# Patient Record
Sex: Female | Born: 1980 | Race: Black or African American | Hispanic: No | Marital: Single | Smoking: Never smoker
Health system: Southern US, Community
[De-identification: ages and names within clinical notes are randomized; demographics above are authoritative.]

---

## 2016-05-05 ENCOUNTER — Other Ambulatory Visit (HOSPITAL_COMMUNITY)
Admission: RE | Admit: 2016-05-05 | Discharge: 2016-05-05 | Disposition: A | Discharge status: Home / Self Care | Payer: Managed Care, Other (non HMO) | Source: Ambulatory Visit | Attending: Obstetrics & Gynecology | Admitting: Obstetrics & Gynecology

## 2016-05-05 ENCOUNTER — Other Ambulatory Visit: Payer: Self-pay | Admitting: Obstetrics & Gynecology

## 2016-05-05 DIAGNOSIS — Z01419 Encounter for gynecological examination (general) (routine) without abnormal findings: Secondary | ICD-10-CM | POA: Diagnosis present

## 2016-05-05 DIAGNOSIS — Z1151 Encounter for screening for human papillomavirus (HPV): Secondary | ICD-10-CM | POA: Diagnosis not present

## 2016-05-05 DIAGNOSIS — Z113 Encounter for screening for infections with a predominantly sexual mode of transmission: Secondary | ICD-10-CM | POA: Diagnosis present

## 2016-05-10 LAB — CYTOLOGY - PAP
Chlamydia: NEGATIVE
DIAGNOSIS: NEGATIVE
HPV (WINDOPATH): NOT DETECTED
Neisseria Gonorrhea: NEGATIVE

## 2016-10-07 ENCOUNTER — Emergency Department (HOSPITAL_COMMUNITY)
Admission: EM | Admit: 2016-10-07 | Discharge: 2016-10-07 | Disposition: A | Discharge status: Home / Self Care | Payer: Managed Care, Other (non HMO) | Attending: Emergency Medicine | Admitting: Emergency Medicine

## 2016-10-07 ENCOUNTER — Encounter (HOSPITAL_COMMUNITY): Payer: Self-pay | Admitting: Emergency Medicine

## 2016-10-07 ENCOUNTER — Emergency Department (HOSPITAL_COMMUNITY): Payer: Managed Care, Other (non HMO)

## 2016-10-07 DIAGNOSIS — K429 Umbilical hernia without obstruction or gangrene: Secondary | ICD-10-CM | POA: Insufficient documentation

## 2016-10-07 DIAGNOSIS — R110 Nausea: Secondary | ICD-10-CM | POA: Diagnosis not present

## 2016-10-07 DIAGNOSIS — N938 Other specified abnormal uterine and vaginal bleeding: Secondary | ICD-10-CM | POA: Insufficient documentation

## 2016-10-07 DIAGNOSIS — R109 Unspecified abdominal pain: Secondary | ICD-10-CM | POA: Diagnosis present

## 2016-10-07 LAB — CBC WITH DIFFERENTIAL/PLATELET
Basophils Absolute: 0 10*3/uL (ref 0.0–0.1)
Basophils Relative: 0 %
EOS PCT: 1 %
Eosinophils Absolute: 0.1 10*3/uL (ref 0.0–0.7)
HCT: 38.6 % (ref 36.0–46.0)
HEMOGLOBIN: 13.2 g/dL (ref 12.0–15.0)
LYMPHS PCT: 40 %
Lymphs Abs: 3.1 10*3/uL (ref 0.7–4.0)
MCH: 29.9 pg (ref 26.0–34.0)
MCHC: 34.2 g/dL (ref 30.0–36.0)
MCV: 87.5 fL (ref 78.0–100.0)
Monocytes Absolute: 0.3 10*3/uL (ref 0.1–1.0)
Monocytes Relative: 4 %
NEUTROS PCT: 55 %
Neutro Abs: 4.3 10*3/uL (ref 1.7–7.7)
PLATELETS: 336 10*3/uL (ref 150–400)
RBC: 4.41 MIL/uL (ref 3.87–5.11)
RDW: 13 % (ref 11.5–15.5)
WBC: 7.8 10*3/uL (ref 4.0–10.5)

## 2016-10-07 LAB — URINALYSIS, ROUTINE W REFLEX MICROSCOPIC
Bilirubin Urine: NEGATIVE
Glucose, UA: NEGATIVE mg/dL
Ketones, ur: NEGATIVE mg/dL
NITRITE: NEGATIVE
Protein, ur: NEGATIVE mg/dL
SPECIFIC GRAVITY, URINE: 1.005 (ref 1.005–1.030)
pH: 8 (ref 5.0–8.0)

## 2016-10-07 LAB — COMPREHENSIVE METABOLIC PANEL
ALK PHOS: 27 U/L — AB (ref 38–126)
ALT: 10 U/L — AB (ref 14–54)
AST: 22 U/L (ref 15–41)
Albumin: 3.5 g/dL (ref 3.5–5.0)
Anion gap: 9 (ref 5–15)
BUN: 5 mg/dL — AB (ref 6–20)
CHLORIDE: 102 mmol/L (ref 101–111)
CO2: 26 mmol/L (ref 22–32)
Calcium: 8.8 mg/dL — ABNORMAL LOW (ref 8.9–10.3)
Creatinine, Ser: 0.52 mg/dL (ref 0.44–1.00)
GFR calc Af Amer: >60 mL/min (ref >60)
Glucose, Bld: 84 mg/dL (ref 65–99)
Potassium: 3.5 mmol/L (ref 3.5–5.1)
Sodium: 137 mmol/L (ref 135–145)
Total Bilirubin: 1 mg/dL (ref 0.3–1.2)
Total Protein: 6.9 g/dL (ref 6.5–8.1)

## 2016-10-07 LAB — LIPASE, BLOOD: LIPASE: 18 U/L (ref 11–51)

## 2016-10-07 LAB — POC URINE PREG, ED: PREG TEST UR: NEGATIVE

## 2016-10-07 MED ORDER — IOPAMIDOL (ISOVUE-300) INJECTION 61%
INTRAVENOUS | Status: AC
  Administered 2016-10-07: 80 mL
  Filled 2016-10-07: qty 100

## 2016-10-07 NOTE — ED Provider Notes (Signed)
[facility] Provider Note   CSN: [id number] Arrival date & time: 10/07/16  1040  By signing my name below, I, Thelma Barge, attest that this documentation has been prepared under the direction and in the presence of No att. providers found. Electronically Signed: Thelma Barge, Scribe. 10/07/16. 1:15 PM.  History   Chief Complaint Chief Complaint  Patient presents with  . Abdominal Pain   The history is provided by the patient. No language interpreter was used.   HPI Comments: Pamela Craig is a 36 y.o. female with no pertinent PMHx or PSHx who presents to the Emergency Department complaining of waxing and waning, aching abdominal pain that began x3 days ago. She has associated nausea. She states she noticed a lump about a month ago but did not think too much of it after it went away. She states when she urinates, her abdomen pain worsens. Pt notes she is spotting and has not had a cycle in a few months due to her birth control.   She denies fever, vomiting, and discharge. Pt is allergic to aspirin.       History reviewed. No pertinent past medical history.  There are no active problems to display for this patient.   History reviewed. No pertinent surgical history.  OB History    No data available       Home Medications    Prior to Admission medications   Not on File    Family History History reviewed. No pertinent family history.  Social History Social History  Substance Use Topics  . Smoking status: Never Smoker  . Smokeless tobacco: Never Used  . Alcohol use Yes     Comment: occ     Allergies   Aspirin   Review of Systems Review of Systems  Constitutional: Negative for fever.  Gastrointestinal: Positive for abdominal pain and nausea. Negative for vomiting.  Genitourinary: Positive for vaginal bleeding (spotting). Negative for vaginal discharge.  All other systems reviewed and are negative.    Physical Exam Updated Vital Signs BP  127/76 (BP Location: Right Arm)   Pulse 92   Temp 99.5 F (37.5 C) (Oral)   Resp 19   SpO2 98%   Physical Exam  Constitutional: She is oriented to person, place, and time. She appears well-developed and well-nourished.  HENT:  Head: Normocephalic and atraumatic.  Cardiovascular: Normal rate and regular rhythm.   No murmur heard. Pulmonary/Chest: Effort normal and breath sounds normal. No respiratory distress.  Abdominal: Soft. There is tenderness. There is no rebound and no guarding.  Mild lower abdominal tenderness 1cm umbilical hernia that is easily reducible on examination  Musculoskeletal: She exhibits no edema or tenderness.  Neurological: She is alert and oriented to person, place, and time.  Skin: Skin is warm and dry.  Psychiatric: She has a normal mood and affect. Her behavior is normal.  Nursing note and vitals reviewed.    ED Treatments / Results  DIAGNOSTIC STUDIES: Oxygen Saturation is 97% on RA, normal by my interpretation.    COORDINATION OF CARE: 12:27 PM Discussed treatment plan with pt at bedside and pt agreed to plan.  Labs (all labs ordered are listed, but only abnormal results are displayed) Labs Reviewed  URINALYSIS, ROUTINE W REFLEX MICROSCOPIC - Abnormal; Notable for the following:       Result Value   Color, Urine STRAW (*)    Hgb urine dipstick MODERATE (*)    Leukocytes, UA TRACE (*)    Bacteria, UA RARE (*)  Squamous Epithelial / LPF 0-5 (*)    All other components within normal limits  COMPREHENSIVE METABOLIC PANEL - Abnormal; Notable for the following:    BUN 5 (*)    Calcium 8.8 (*)    ALT 10 (*)    Alkaline Phosphatase 27 (*)    All other components within normal limits  CBC WITH DIFFERENTIAL/PLATELET  LIPASE, BLOOD  POC URINE PREG, ED    EKG  EKG Interpretation None       Radiology Ct Abdomen Pelvis W Contrast  Result Date: 10/07/2016 CLINICAL DATA:  Umbilical pain. Nausea and vomiting. Pain for 1 month increasing over  several days EXAM: CT ABDOMEN AND PELVIS WITH CONTRAST TECHNIQUE: Multidetector CT imaging of the abdomen and pelvis was performed using the standard protocol following bolus administration of intravenous contrast. CONTRAST:  80mL ISOVUE-300 IOPAMIDOL (ISOVUE-300) INJECTION 61% COMPARISON:  None. FINDINGS: Lower chest: Lung bases are clear. Hepatobiliary: No focal hepatic lesion. No biliary duct dilatation. Gallbladder is normal. Common bile duct is normal. Pancreas: Pancreas is normal. No ductal dilatation. No pancreatic inflammation. Spleen: Normal spleen Adrenals/urinary tract: Adrenal glands and kidneys are normal. The ureters and bladder normal. Stomach/Bowel: The stomach, small bowel, appendix and cecum normal. The colon and rectosigmoid colon are normal. There is laxity of the abdominal wall midline with a loop of colon approximating the skin surface (image 47, series 3; sagittal image 74, series 7) at the level the umbilicus. Vascular/Lymphatic: Abdominal aorta is normal caliber. There is no retroperitoneal or periportal lymphadenopathy. No pelvic lymphadenopathy. Reproductive: Uterus and ovaries normal Other: No free fluid. Musculoskeletal: No aggressive osseous lesion. IMPRESSION: 1. Laxity abdominal wall at the umbilicus with a loop of colon approximating the ventral abdominal wall within 5 mm the skin surface. No frank herniation. This could represent a region of discomfort. 2. Normal appendix. 3. No obstructive uropathy. Electronically Signed   By: Genevive BiStewart  Edmunds M.D.   On: 10/07/2016 14:32    Procedures Procedures (including critical care time)  Medications Ordered in ED Medications  iopamidol (ISOVUE-300) 61 % injection (80 mLs  Contrast Given 10/07/16 1415)     Initial Impression / Assessment and Plan / ED Course  I have reviewed the triage vital signs and the nursing notes.  Pertinent labs & imaging results that were available during my care of the patient were reviewed by me and  considered in my medical decision making (see chart for details).     Patient here for central and lower abdominal pain, has a hernia on examination that is easily reproducible. No evidence of obstruction or incarceration on examination or imaging. UA is not consistent with UTI, will not treat for infection. Counseled pt on home care for hernia with outpatient surgery follow-up, lifting precautions. Return precautions discussed.  Final Clinical Impressions(s) / ED Diagnoses   Final diagnoses:  Umbilical hernia without obstruction and without gangrene    New Prescriptions There are no discharge medications for this patient. I personally performed the services described in this documentation, which was scribed in my presence. The recorded information has been reviewed and is accurate.    Tilden Fossaees, Satoru Milich, MD 10/07/16 1544

## 2016-10-07 NOTE — ED Notes (Signed)
Pt going to CT

## 2016-10-07 NOTE — ED Triage Notes (Signed)
Pt sts abd pain with possible hernia near belly button per pt; pt sts some dysuria today also

## 2016-10-07 NOTE — Progress Notes (Signed)
Orthopedic Tech Progress Note Patient Details:  Pamela Craig 28-Jun-1980 [id number]  Ortho Devices Type of Ortho Device: Abdominal binder Ortho Device/Splint Location: abdomen Ortho Device/Splint Interventions: Freeman CaldronOrdered   [name] 10/07/2016, 3:18 PM

## 2017-08-24 ENCOUNTER — Encounter (HOSPITAL_COMMUNITY): Payer: Self-pay | Admitting: Emergency Medicine

## 2017-08-24 ENCOUNTER — Ambulatory Visit (HOSPITAL_COMMUNITY)
Admission: EM | Admit: 2017-08-24 | Discharge: 2017-08-24 | Disposition: A | Discharge status: Home / Self Care | Payer: Managed Care, Other (non HMO) | Attending: Family Medicine | Admitting: Family Medicine

## 2017-08-24 DIAGNOSIS — R509 Fever, unspecified: Secondary | ICD-10-CM | POA: Diagnosis not present

## 2017-08-24 DIAGNOSIS — Z886 Allergy status to analgesic agent status: Secondary | ICD-10-CM | POA: Insufficient documentation

## 2017-08-24 DIAGNOSIS — J029 Acute pharyngitis, unspecified: Secondary | ICD-10-CM | POA: Insufficient documentation

## 2017-08-24 LAB — POCT RAPID STREP A: Streptococcus, Group A Screen (Direct): NEGATIVE

## 2017-08-24 MED ORDER — AMOXICILLIN 875 MG PO TABS: 875.0000 mg | ORAL_TABLET | Freq: Two times a day (BID) | ORAL | 0 refills | Status: AC

## 2017-08-24 NOTE — ED Provider Notes (Signed)
[facility]   [id number] 08/24/17 Arrival Time: 1003  ASSESSMENT & PLAN:  1. Sore throat   2. Fever, unspecified fever cause    Suspicion for strep. Will treat.  Meds ordered this encounter  Medications  . amoxicillin (AMOXIL) 875 MG tablet    Sig: Take 1 tablet (875 mg total) by mouth 2 (two) times daily for 10 days.    Dispense:  20 tablet    Refill:  0    Results for orders placed or performed during the hospital encounter of 08/24/17  POCT rapid strep A [facility])  Result Value Ref Range   Streptococcus, Group A Screen (Direct) NEGATIVE NEGATIVE   Labs Reviewed  CULTURE, GROUP A STREP [facility])  POCT RAPID STREP A   Work note given. OTC analgesics and throat care as needed  Instructed to finish full 10 day course of antibiotics. Will follow up if not showing significant improvement over the next 24-48 hours.  Reviewed expectations re: course of current medical issues. Questions answered. Outlined signs and symptoms indicating need for more acute intervention. Patient verbalized understanding. After Visit Summary given.   SUBJECTIVE:  Pamela Craig is a 37 y.o. female who reports a sore throat. Describes as pain with swallowing. Onset abrupt beginning 1 day ago. No respiratory symptoms. Normal PO intake but reports discomfort with swallowing. Fever reported: yes, subjective with chills. No associated n/v/abdominal symptoms. Sick contacts: none.  OTC treatment: Analgesics without much relief.  ROS: As per HPI.   OBJECTIVE:  Vitals:   08/24/17 1043 08/24/17 1045  BP:  134/83  Pulse:  90  Resp:  16  Temp:  100.2 F (37.9 C)  TempSrc:  Oral  SpO2:  96%  Weight: 150 lb (68 kg)     General appearance: alert; no distress HEENT: throat with marked erythema and exudates present; uvula midline Neck: supple with FROM; small bilateral cervical LAD, tender Lungs: clear to auscultation bilaterally Skin: reveals no rash; warm and  dry Psychological: alert and cooperative; normal mood and affect  Allergies  Allergen Reactions  . Aspirin      Social History   Socioeconomic History  . Marital status: Single    Spouse name: Not on file  . Number of children: Not on file  . Years of education: Not on file  . Highest education level: Not on file  Occupational History  . Not on file  Social Needs  . Financial resource strain: Not on file  . Food insecurity:    Worry: Not on file    Inability: Not on file  . Transportation needs:    Medical: Not on file    Non-medical: Not on file  Tobacco Use  . Smoking status: Never Smoker  . Smokeless tobacco: Never Used  Substance and Sexual Activity  . Alcohol use: Yes    Comment: occ  . Drug use: No  . Sexual activity: Not on file  Lifestyle  . Physical activity:    Days per week: Not on file    Minutes per session: Not on file  . Stress: Not on file  Relationships  . Social connections:    Talks on phone: Not on file    Gets together: Not on file    Attends religious service: Not on file    Active member of club or organization: Not on file    Attends meetings of clubs or organizations: Not on file    Relationship status: Not on file  .  Intimate partner violence:    Fear of current or ex partner: Not on file    Emotionally abused: Not on file    Physically abused: Not on file    Forced sexual activity: Not on file  Other Topics Concern  . Not on file  Social History Narrative  . Not on file   No family history on file.        Mardella LaymanHagler, Kerria Sapien, MD 08/24/17 1140

## 2017-08-24 NOTE — ED Triage Notes (Signed)
PT reports sore throat, chills, body aches for 2 days.

## 2017-08-24 NOTE — Discharge Instructions (Addendum)
You may use over the counter ibuprofen or acetaminophen as needed.  For a sore throat, over the counter products such as [employer] Peroxyl Mouth Sore Rinse or Chloraseptic Sore Throat Spray may provide some temporary relief. Your rapid strep test was negative today. We have sent your throat swab for culture and will let you know of any positive results. 

## 2017-08-26 ENCOUNTER — Telehealth (HOSPITAL_COMMUNITY): Payer: Self-pay

## 2017-08-26 LAB — CULTURE, GROUP A STREP (THRC)

## 2017-08-26 NOTE — Telephone Encounter (Signed)
Returned patients call. No answer at this time. Voicemail left for patient.

## 2017-08-26 NOTE — Telephone Encounter (Signed)
Pt aware results from recent visit being within normal range. Answered all questions.

## 2017-11-08 ENCOUNTER — Ambulatory Visit (HOSPITAL_COMMUNITY)
Admission: EM | Admit: 2017-11-08 | Discharge: 2017-11-08 | Disposition: A | Discharge status: Home / Self Care | Payer: Managed Care, Other (non HMO) | Attending: Emergency Medicine | Admitting: Emergency Medicine

## 2017-11-08 ENCOUNTER — Encounter (HOSPITAL_COMMUNITY): Payer: Self-pay | Admitting: Emergency Medicine

## 2017-11-08 DIAGNOSIS — Z634 Disappearance and death of family member: Secondary | ICD-10-CM

## 2017-11-08 DIAGNOSIS — R079 Chest pain, unspecified: Secondary | ICD-10-CM | POA: Diagnosis not present

## 2017-11-08 DIAGNOSIS — R0789 Other chest pain: Secondary | ICD-10-CM | POA: Diagnosis not present

## 2017-11-08 LAB — POCT I-STAT, CHEM 8
BUN: 5 mg/dL — ABNORMAL LOW (ref 6–20)
CALCIUM ION: 1.18 mmol/L (ref 1.15–1.40)
CREATININE: 0.5 mg/dL (ref 0.44–1.00)
Chloride: 104 mmol/L (ref 101–111)
GLUCOSE: 91 mg/dL (ref 65–99)
HCT: 41 % (ref 36.0–46.0)
Hemoglobin: 13.9 g/dL (ref 12.0–15.0)
Potassium: 3.3 mmol/L — ABNORMAL LOW (ref 3.5–5.1)
Sodium: 142 mmol/L (ref 135–145)
TCO2: 24 mmol/L (ref 22–32)

## 2017-11-08 MED ORDER — CYCLOBENZAPRINE HCL 5 MG PO TABS: 5.0000 mg | ORAL_TABLET | Freq: Three times a day (TID) | ORAL | 0 refills | Status: AC | PRN

## 2017-11-08 NOTE — ED Triage Notes (Signed)
PT reports central chest pain. PT reports pain is intermittent and does not follow any patterns. Occurs randomly. Is sometimes worse when raising left arm. PT's mother passed away recently due to heart attack so PT wants to rule out cardiac causes.

## 2017-11-08 NOTE — ED Provider Notes (Signed)
MRN: [id number] DOB: 1980/06/26  Subjective:   Pamela Craig is a 37 y.o. female presenting for ~1 month history of atypical central, left lateral chest pain. It occurs randomly in either spot, can worsen by lifting her left arm. Can last up to an hour. Resolves on its own by resting, breathing deeply or walking. Can have some nausea with her symptoms. Denies fever, abdominal pain, diaphoresis. Also denies trauma, falls, strenuous exercise. Denies smoking cigarettes. Has occasional drink of alcohol.  Of note, patient reports that her mother passed away in March 2019 from cardiac arrest, had been battling stomach cancer.  She is very worried about her heart.  Her significant other presents with her as well and states that they are having a very difficult time with her relationship at the moment.  He reports that they are working on the relationship but endorses that there is an anxiety component to her chest pain.  She does not have a PCP.  Denies any chronic medications.   Allergies  Allergen Reactions  . Aspirin    Denies past medical history and past surgical history.   Objective:   Vitals: BP 112/74   Pulse 66   Temp 98.7 F (37.1 C) (Oral)   Resp 16   Wt 160 lb (72.6 kg)   LMP 10/24/2017   SpO2 100%   Physical Exam  Constitutional: She is oriented to person, place, and time. She appears well-developed and well-nourished.  HENT:  Mouth/Throat: Oropharynx is clear and moist.  Eyes: Pupils are equal, round, and reactive to light. EOM are normal.  Neck: Normal range of motion. Neck supple. No thyromegaly present.  Cardiovascular: Normal rate, regular rhythm and intact distal pulses. Exam reveals no gallop and no friction rub.  No murmur heard. Pulmonary/Chest: Effort normal. No respiratory distress. She has no wheezes. She has no rales.  Neurological: She is alert and oriented to person, place, and time.  Skin: Skin is warm and dry.  Psychiatric:  Flat affect, teary-eyed.    ED ECG REPORT   Date: 11/08/2017  Rate: sinus bradycardia at 58bpm  Rhythm: sinus bradycardia  QRS Axis: normal  Intervals: normal  ST/T Wave abnormalities: T-wave inversion in leads V1, V2  Conduction Disutrbances:none  Narrative Interpretation: sinus bradycardia  Old EKG Reviewed: none available  I have personally reviewed the EKG tracing and agree with the computerized printout as noted.  Results for orders placed or performed during the hospital encounter of 11/08/17 (from the past 24 hour(s))  I-STAT, chem 8     Status: Abnormal   Collection Time: 11/08/17  8:13 PM  Result Value Ref Range   Sodium 142 135 - 145 mmol/L   Potassium 3.3 (L) 3.5 - 5.1 mmol/L   Chloride 104 101 - 111 mmol/L   BUN 5 (L) 6 - 20 mg/dL   Creatinine, Ser 4.090.50 0.44 - 1.00 mg/dL   Glucose, Bld 91 65 - 99 mg/dL   Calcium, Ion 8.111.18 9.141.15 - 1.40 mmol/L   TCO2 24 22 - 32 mmol/L   Hemoglobin 13.9 12.0 - 15.0 g/dL   HCT 78.241.0 95.636.0 - 21.346.0 %    Assessment and Plan :   Atypical chest pain  Death of family member  Chest pain of unclear etiology given lack of trauma, strenuous activity and reassuring vital signs.  Potassium is borderline low which may be a source but is nonspecific.  Counseled patient on sources of chest pain including emotional and physical stress.  Will manage it as  musculoskeletal type pain with rest, Flexeril and hydration.  Patient was instructed to eat potassium rich foods.  She agreed to establish care with a PCP for further work-up.  ER and return to clinic precautions discussed.   Wallis Bamberg, PA-C 11/09/17 1050

## 2017-11-08 NOTE — Discharge Instructions (Addendum)
Hydrate well with at least 2 liters (1 gallon) of water daily.   Nuts, such as peanuts and pistachios. Seeds, such as sunflower seeds and pumpkin seeds. Peas, lentils, and lima beans. Whole grain and bran cereals and breads. Fresh fruits and vegetables, such as apricots, avocado, bananas, cantaloupe, kiwi, oranges, tomatoes, asparagus, and potatoes. Orange juice. Tomato juice. Red meats. Yogurt.  Wallis BambergMario Adisyn Ruscitti, PA-C [facility] at [facility] : [facility] [address], [place], [place] [address] [phone]

## 2018-01-16 ENCOUNTER — Encounter: Payer: Self-pay | Admitting: Emergency Medicine

## 2018-01-16 ENCOUNTER — Ambulatory Visit (HOSPITAL_COMMUNITY)
Admission: EM | Admit: 2018-01-16 | Discharge: 2018-01-16 | Disposition: A | Discharge status: Home / Self Care | Payer: Managed Care, Other (non HMO) | Attending: Family Medicine | Admitting: Family Medicine

## 2018-01-16 DIAGNOSIS — H9202 Otalgia, left ear: Secondary | ICD-10-CM

## 2018-01-16 MED ORDER — NEOMYCIN-POLYMYXIN-HC 3.5-10000-1 OT SUSP: 4.0000 [drp] | Freq: Four times a day (QID) | OTIC | 0 refills | Status: AC

## 2018-01-16 MED ORDER — FLUTICASONE PROPIONATE 50 MCG/ACT NA SUSP: 1.0000 | Freq: Every day | NASAL | 2 refills | Status: AC

## 2018-01-16 NOTE — ED Provider Notes (Signed)
[facility]    CSN: [id number] Arrival date & time: 01/16/18  1535     History   Chief Complaint Chief Complaint  Patient presents with  . Otalgia    Left Ear     HPI Pamela Craig is a 37 y.o. female.   Pamela Craig presents with complaints of left ear pain and decreased hearing. States she felt like it popped this morning and then hearing decreased. She put a cotton ball in the ear this am and noticed small amount of blood. No drainage since. No fevers. Onset this am. Felt well yesterday. Did develop some nasal congestion yesterday and had taken benadryl. No cough or sore throat. No rash. Has not taken any medications for symptoms. Ear feels "sensitive." Without contributing medical history.      ROS per HPI.      History reviewed. No pertinent past medical history.  There are no active problems to display for this patient.   History reviewed. No pertinent surgical history.  OB History   None      Home Medications    Prior to Admission medications   Medication Sig Start Date End Date Taking? Authorizing Provider  cyclobenzaprine (FLEXERIL) 5 MG tablet Take 1 tablet (5 mg total) by mouth 3 (three) times daily as needed for muscle spasms. 11/08/17   Wallis BambergMani, Mario, PA-C  fluticasone (FLONASE) 50 MCG/ACT nasal spray Place 1 spray into both nostrils daily. 01/16/18   Georgetta HaberBurky, Natalie B, NP  neomycin-polymyxin-hydrocortisone (CORTISPORIN) 3.5-10000-1 OTIC suspension Place 4 drops into the left ear 4 (four) times daily for 7 days. 01/16/18 01/23/18  Georgetta HaberBurky, Natalie B, NP    Family History No family history on file.  Social History Social History   Tobacco Use  . Smoking status: Never Smoker  . Smokeless tobacco: Never Used  Substance Use Topics  . Alcohol use: Yes    Comment: occ  . Drug use: No     Allergies   Aspirin   Review of Systems Review of Systems   Physical Exam Triage Vital Signs ED Triage Vitals  Enc Vitals Group     BP 01/16/18  1604 111/82     Pulse Rate 01/16/18 1604 76     Resp 01/16/18 1604 20     Temp 01/16/18 1604 97.9 F (36.6 C)     Temp Source 01/16/18 1604 Temporal     SpO2 01/16/18 1604 98 %     Weight --      Height --      Head Circumference --      Peak Flow --      Pain Score 01/16/18 1602 7     Pain Loc --      Pain Edu? --      Excl. in GC? --    No data found.  Updated Vital Signs BP 111/82 (BP Location: Right Arm)   Pulse 76   Temp 97.9 F (36.6 C) (Temporal)   Resp 20   LMP 01/10/2018   SpO2 98%    Physical Exam  Constitutional: She is oriented to person, place, and time. She appears well-developed and well-nourished. No distress.  HENT:  Head: Normocephalic and atraumatic.  Right Ear: Tympanic membrane, external ear and ear canal normal.  Left Ear: Tympanic membrane and external ear normal.  Nose: Nose normal.  Mouth/Throat: Uvula is midline, oropharynx is clear and moist and mucous membranes are normal. No tonsillar exudate.  Left TM WNL; tenderness to canal with exam however,  no drainage; no significant swelling or pus drainage  Eyes: Pupils are equal, round, and reactive to light. Conjunctivae and EOM are normal.  Cardiovascular: Normal rate, regular rhythm and normal heart sounds.  Pulmonary/Chest: Effort normal and breath sounds normal.  Neurological: She is alert and oriented to person, place, and time.  Skin: Skin is warm and dry.     UC Treatments / Results  Labs (all labs ordered are listed, but only abnormal results are displayed) Labs Reviewed - No data to display  EKG None  Radiology No results found.  Procedures Procedures (including critical care time)  Medications Ordered in UC Medications - No data to display  Initial Impression / Assessment and Plan / UC Course  I have reviewed the triage vital signs and the nursing notes.  Pertinent labs & imaging results that were available during my care of the patient were reviewed by me and considered  in my medical decision making (see chart for details).     Tm intact, no further drainage. Will provide drops for external pain, use of flonase to help with congestion/popping sensation. If symptoms worsen or do not improve in the next week to return to be seen or to follow up with PCP.  Patient verbalized understanding and agreeable to plan.    Final Clinical Impressions(s) / UC Diagnoses   Final diagnoses:  Left ear pain     Discharge Instructions     We will treat external ear infection with ear drops, please complete. You may also use daily flonase to help with any fluid behind ear drum, use daily.  If symptoms worsen or do not improve in the next week to return to be seen or to follow up with your PCP.     ED Prescriptions    Medication Sig Dispense Auth. Provider   neomycin-polymyxin-hydrocortisone (CORTISPORIN) 3.5-10000-1 OTIC suspension Place 4 drops into the left ear 4 (four) times daily for 7 days. 10 mL Linus Mako B, NP   fluticasone (FLONASE) 50 MCG/ACT nasal spray Place 1 spray into both nostrils daily. 16 g Georgetta Haber, NP     Controlled Substance Prescriptions [place] Controlled Substance Registry consulted? Not Applicable   Georgetta Haber, NP 01/16/18 1704

## 2018-01-16 NOTE — Discharge Instructions (Signed)
We will treat external ear infection with ear drops, please complete. You may also use daily flonase to help with any fluid behind ear drum, use daily.  If symptoms worsen or do not improve in the next week to return to be seen or to follow up with your PCP.

## 2018-01-16 NOTE — ED Triage Notes (Signed)
Pt presents with pain left ear, lose of hearing in that ear and a bloody drainage from that ear.  Pt states she heard a pop before she started feeling symptoms, has not been traveling or put anything in her ear.

## 2018-06-06 ENCOUNTER — Emergency Department (HOSPITAL_COMMUNITY): Payer: Managed Care, Other (non HMO)

## 2018-06-06 ENCOUNTER — Emergency Department (HOSPITAL_COMMUNITY)
Admission: EM | Admit: 2018-06-06 | Discharge: 2018-06-06 | Disposition: A | Discharge status: Home / Self Care | Payer: Managed Care, Other (non HMO) | Attending: Emergency Medicine | Admitting: Emergency Medicine

## 2018-06-06 ENCOUNTER — Encounter: Payer: Self-pay | Admitting: Emergency Medicine

## 2018-06-06 DIAGNOSIS — Z3A01 Less than 8 weeks gestation of pregnancy: Secondary | ICD-10-CM | POA: Diagnosis not present

## 2018-06-06 DIAGNOSIS — O23599 Infection of other part of genital tract in pregnancy, unspecified trimester: Secondary | ICD-10-CM

## 2018-06-06 DIAGNOSIS — O200 Threatened abortion: Secondary | ICD-10-CM | POA: Diagnosis not present

## 2018-06-06 DIAGNOSIS — O23591 Infection of other part of genital tract in pregnancy, first trimester: Secondary | ICD-10-CM | POA: Diagnosis not present

## 2018-06-06 DIAGNOSIS — B9689 Other specified bacterial agents as the cause of diseases classified elsewhere: Secondary | ICD-10-CM | POA: Insufficient documentation

## 2018-06-06 DIAGNOSIS — O2391 Unspecified genitourinary tract infection in pregnancy, first trimester: Secondary | ICD-10-CM | POA: Diagnosis not present

## 2018-06-06 DIAGNOSIS — O2341 Unspecified infection of urinary tract in pregnancy, first trimester: Secondary | ICD-10-CM

## 2018-06-06 DIAGNOSIS — O209 Hemorrhage in early pregnancy, unspecified: Secondary | ICD-10-CM | POA: Diagnosis present

## 2018-06-06 LAB — URINALYSIS, ROUTINE W REFLEX MICROSCOPIC
BILIRUBIN URINE: NEGATIVE
GLUCOSE, UA: NEGATIVE mg/dL
KETONES UR: NEGATIVE mg/dL
Nitrite: POSITIVE — AB
PH: 7 (ref 5.0–8.0)
Protein, ur: NEGATIVE mg/dL
Specific Gravity, Urine: 1.006 (ref 1.005–1.030)

## 2018-06-06 LAB — CBC
HEMATOCRIT: 37.9 % (ref 36.0–46.0)
HEMOGLOBIN: 12.7 g/dL (ref 12.0–15.0)
MCH: 30 pg (ref 26.0–34.0)
MCHC: 33.5 g/dL (ref 30.0–36.0)
MCV: 89.6 fL (ref 80.0–100.0)
PLATELETS: 289 10*3/uL (ref 150–400)
RBC: 4.23 MIL/uL (ref 3.87–5.11)
RDW: 12.9 % (ref 11.5–15.5)
WBC: 6.1 10*3/uL (ref 4.0–10.5)
nRBC: 0 % (ref 0.0–0.2)

## 2018-06-06 LAB — ABO/RH: ABO/RH(D): A POS

## 2018-06-06 LAB — WET PREP, GENITAL
SPERM: NONE SEEN
Trich, Wet Prep: NONE SEEN
Yeast Wet Prep HPF POC: NONE SEEN

## 2018-06-06 LAB — HCG, QUANTITATIVE, PREGNANCY: hCG, Beta Chain, Quant, S: 8884 m[IU]/mL — ABNORMAL HIGH (ref <5)

## 2018-06-06 MED ORDER — CLINDAMYCIN PHOSPHATE 2 % VA CREA: 1.0000 | TOPICAL_CREAM | Freq: Every day | VAGINAL | 0 refills | Status: AC

## 2018-06-06 MED ORDER — CEPHALEXIN 500 MG PO CAPS: 500.0000 mg | ORAL_CAPSULE | Freq: Four times a day (QID) | ORAL | 0 refills | Status: AC

## 2018-06-06 NOTE — ED Triage Notes (Signed)
Pt reports she is [redacted] weeks pregnant and today had vaginal spotting that went away but when at work noticed more brighter red blood on panty-liner but didn't saturate the pad. Pt reports some abd cramping.

## 2018-06-06 NOTE — ED Provider Notes (Signed)
[facility]-EMERGENCY DEPT Provider Note   CSN: [id number] Arrival date & time: 06/06/18  1050     History   Chief Complaint Chief Complaint  Patient presents with  . Vaginal Bleeding    HPI Pamela Craig is a 38 y.o. female.  HPI Patient developed vaginal bleeding last night.  States she is about [redacted] weeks pregnant last menses middle of December.  Has positive home [facility] test and [facility]  Last night began to have some vaginal bleeding some dull abdominal cramping.  States she feels that she could getting her menses.  Slightly red blood but is only used a couple pads.  Does not know her blood type.  [facility] but has not gone to the appointment yet. History reviewed. No pertinent past medical history.  There are no active problems to display for this patient.   History reviewed. No pertinent surgical history.   OB History    Gravida  1   Para      Term      Preterm      AB      Living        SAB      TAB      Ectopic      Multiple      Live Births               Home Medications    Prior to Admission medications   Medication Sig Start Date End Date Taking? Authorizing Provider  Prenatal Vit-Fe Fumarate-FA (PRENATAL PO) Take 1 tablet by mouth 2 (two) times daily.   Yes [provider]  cephALEXin (KEFLEX) 500 MG capsule Take 1 capsule (500 mg total) by mouth 4 (four) times daily. 06/06/18   Benjiman CorePickering, Vickie Melnik, MD  clindamycin (CLEOCIN) 2 % vaginal cream Place 1 Applicatorful vaginally at bedtime for 7 days. 06/06/18 06/13/18  Benjiman CorePickering, Crist Kruszka, MD  cyclobenzaprine (FLEXERIL) 5 MG tablet Take 1 tablet (5 mg total) by mouth 3 (three) times daily as needed for muscle spasms. Patient not taking: Reported on 06/06/2018 11/08/17   Wallis BambergMani, Mario, PA-C  fluticasone [facility](FLONASE) 50 MCG/ACT nasal spray Place 1 spray into both nostrils daily. Patient not taking: Reported on  06/06/2018 01/16/18   Georgetta HaberBurky, Natalie B, NP    Family History No family history on file.  Social History Social History   Tobacco Use  . Smoking status: Never Smoker  . Smokeless tobacco: Never Used  Substance Use Topics  . Alcohol use: Yes    Comment: occ  . Drug use: No     Allergies   Aspirin   Review of Systems Review of Systems  Constitutional: Negative for appetite change.  HENT: Negative for congestion.   Respiratory: Negative for shortness of breath.   Cardiovascular: Negative for chest pain.  Gastrointestinal: Negative for abdominal pain.  Genitourinary: Positive for vaginal bleeding. Negative for flank pain and vaginal discharge.  Musculoskeletal: Negative for back pain.  Skin: Negative for rash.  Neurological: Negative for weakness.  Psychiatric/Behavioral: Negative for confusion.     Physical Exam Updated Vital Signs BP 119/78   Pulse 78   Temp 98.6 F (37 C) (Oral)   Resp 15   Ht 5\' 2"  (1.575 m)   Wt 64 kg   LMP 04/27/2018   SpO2 100%   BMI 25.79 kg/m   Physical Exam HENT:     Head: Normocephalic.     Mouth/Throat:  Mouth: Mucous membranes are moist.  Eyes:     General: No scleral icterus. Cardiovascular:     Rate and Rhythm: Normal rate.  Pulmonary:     Effort: Pulmonary effort is normal.  Abdominal:     Tenderness: There is no abdominal tenderness.  Musculoskeletal:     Right lower leg: No edema.     Left lower leg: No edema.  Skin:    General: Skin is warm.     Coloration: Skin is not jaundiced.  Neurological:     Mental Status: She is alert.   Pelvic exam showed closed os, but has moderate white discharge.  No cervical motion tenderness.  No adnexal tenderness.   ED Treatments / Results  Labs (all labs ordered are listed, but only abnormal results are displayed) Labs Reviewed  WET PREP, GENITAL - Abnormal; Notable for the following components:      Result Value   Clue Cells Wet Prep HPF POC PRESENT (*)    WBC, Wet  Prep HPF POC FEW (*)    All other components within normal limits  HCG, QUANTITATIVE, PREGNANCY - Abnormal; Notable for the following components:   hCG, Beta Chain, Quant, S 8,884 (*)    All other components within normal limits  URINALYSIS, ROUTINE W REFLEX MICROSCOPIC - Abnormal; Notable for the following components:   APPearance HAZY (*)    Hgb urine dipstick SMALL (*)    Nitrite POSITIVE (*)    Leukocytes, UA SMALL (*)    Bacteria, UA MANY (*)    All other components within normal limits  CBC  RPR  HIV ANTIBODY (ROUTINE TESTING W REFLEX)  ABO/RH  GC/CHLAMYDIA PROBE AMP ([place]) NOT AT [facility]    EKG None  Radiology US Ob Comp < 14 Wks  Result Date: 06/06/2018 CLINICAL DATA:  Initial evaluation for acute vaginal bleeding, early pregnancy. EXAM: OBSTETRIC <14 WK [place] AND TRANSVAGINAL OB US TECHNIQUE: Both transabdominal and transvaginal ultrasound examinations were performed for complete evaluation of the gestation as well as the maternal uterus, adnexal regions, and pelvic cul-de-sac. Transvaginal technique was performed to assess early pregnancy. COMPARISON:  None. FINDINGS: Intrauterine gestational sac: Single Yolk sac:  Not visualized. Embryo:  Not visualized. Cardiac Activity: N/A Heart Rate: N/A  bpm MSD: 8.2 mm   5 w   4 d Subchorionic hemorrhage:  None visualized. Maternal uterus/adnexae: 2.2 x 1.5 x 1.4 cm mildly complex right ovarian cyst with peripherally increased vascularity, most consistent with a small corpus luteal cyst. Small amount of adjacent free fluid. Left ovary normal in appearance. Possible focal lesion within the left aspect of the myometrium measures 2.0 x 1.8 x 1.7 cm, potentially reflecting a small fibroid. IMPRESSION: 1. Probable early intrauterine gestational sac, but no yolk sac, fetal pole, or cardiac activity yet visualized. Recommend follow-up quantitative B-HCG levels and follow-up US in 14 days to assess viability. This recommendation follows SRU  consensus guidelines: Diagnostic Criteria for Nonviable Pregnancy Early in the First Trimester. [name] Med 2013; 353:9122-58. 2. 2.2 cm right ovarian corpus luteal cyst with associated small amount of free fluid within the pelvis. 3. Possible 2 cm fibroid within the left aspect of the uterus. Electronically Signed   By: Rise Mu M.D.   On: 06/06/2018 18:31   US Ob Transvaginal  Result Date: 06/06/2018 CLINICAL DATA:  Initial evaluation for acute vaginal bleeding, early pregnancy. EXAM: OBSTETRIC <14 WK [place] AND TRANSVAGINAL OB US TECHNIQUE: Both transabdominal and transvaginal ultrasound examinations were performed  for complete evaluation of the gestation as well as the maternal uterus, adnexal regions, and pelvic cul-de-sac. Transvaginal technique was performed to assess early pregnancy. COMPARISON:  None. FINDINGS: Intrauterine gestational sac: Single Yolk sac:  Not visualized. Embryo:  Not visualized. Cardiac Activity: N/A Heart Rate: N/A  bpm MSD: 8.2 mm   5 w   4 d Subchorionic hemorrhage:  None visualized. Maternal uterus/adnexae: 2.2 x 1.5 x 1.4 cm mildly complex right ovarian cyst with peripherally increased vascularity, most consistent with a small corpus luteal cyst. Small amount of adjacent free fluid. Left ovary normal in appearance. Possible focal lesion within the left aspect of the myometrium measures 2.0 x 1.8 x 1.7 cm, potentially reflecting a small fibroid. IMPRESSION: 1. Probable early intrauterine gestational sac, but no yolk sac, fetal pole, or cardiac activity yet visualized. Recommend follow-up quantitative B-HCG levels and follow-up US in 14 days to assess viability. This recommendation follows SRU consensus guidelines: Diagnostic Criteria for Nonviable Pregnancy Early in the First Trimester. [name] Med 2013; 497:0263-78. 2. 2.2 cm right ovarian corpus luteal cyst with associated small amount of free fluid within the pelvis. 3. Possible 2 cm fibroid within the left aspect of  the uterus. Electronically Signed   By: Rise Mu M.D.   On: 06/06/2018 18:31    Procedures Procedures (including critical care time)  Medications Ordered in ED Medications - No data to display   Initial Impression / Assessment and Plan / ED Course  I have reviewed the triage vital signs and the nursing notes.  Pertinent labs & imaging results that were available during my care of the patient were reviewed by me and considered in my medical decision making (see chart for details).     Patient with vaginal bleeding.  Around [redacted] weeks pregnant.  Threatened miscarriage.  Quantitative hCG of 8800.  Urine shows possible infection and will treat.  Also BV on wet prep.  Also treated.  Follow-up with OB/GYN.  Final Clinical Impressions(s) / ED Diagnoses   Final diagnoses:  Threatened miscarriage  Urinary tract infection in mother during first trimester of pregnancy  Bacterial vaginosis in pregnancy    ED Discharge Orders         Ordered    cephALEXin (KEFLEX) 500 MG capsule  4 times daily     06/06/18 2116    clindamycin (CLEOCIN) 2 % vaginal cream  Daily at bedtime     06/06/18 2116           Benjiman Core, MD 06/06/18 2132

## 2018-06-06 NOTE — ED Triage Notes (Signed)
Pt reported to registration that she is going to have to leave to pick up her kids from school and that she will be back.

## 2018-06-07 LAB — RPR: RPR Ser Ql: NONREACTIVE

## 2018-06-07 LAB — GC/CHLAMYDIA PROBE AMP (~~LOC~~) NOT AT ARMC
Chlamydia: NEGATIVE
Neisseria Gonorrhea: NEGATIVE

## 2018-06-07 LAB — HIV ANTIBODY (ROUTINE TESTING W REFLEX): HIV Screen 4th Generation wRfx: NONREACTIVE

## 2018-06-18 IMAGING — CT CT ABD-PELV W/ CM
2 of 4 series · 16 of 46 positions shown, 18 images · IV contrast (iopamidol)
Comparison: None.

CLINICAL DATA: Umbilical pain. Nausea and vomiting. Pain for 1
month increasing over several days

EXAM:
CT ABDOMEN AND PELVIS WITH CONTRAST
TECHNIQUE: Multidetector CT imaging of the abdomen and pelvis was performed
using the standard protocol following bolus administration of
intravenous contrast.
CONTRAST:  80mL [id number] IOPAMIDOL ([id number]) INJECTION 61%

[Series 3: abdroutine 5.0 i31s 1 · axial · 0.62mm/px · z∈[-426,-26]mm · 13 of 90 slices shown, 15 images]
[im 5/90  soft-tissue]
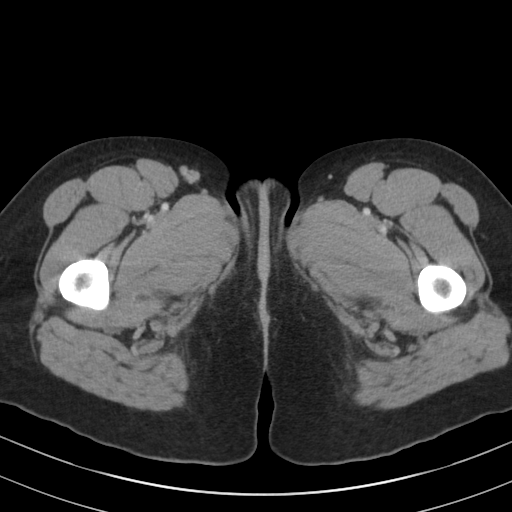
[im 5/90  bone]
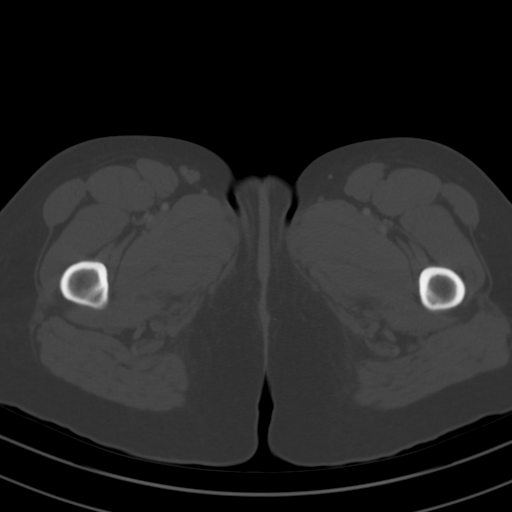
[im 13/90  soft-tissue]
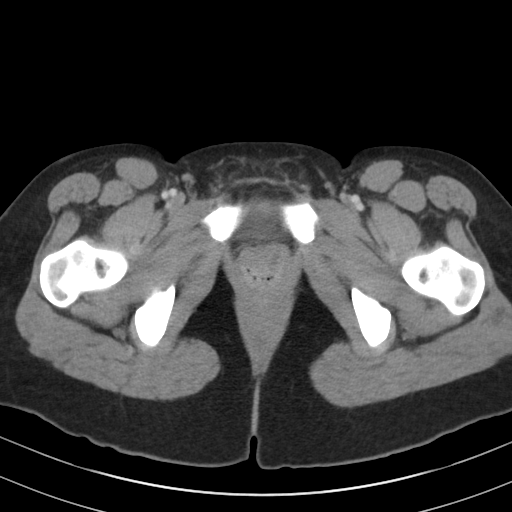
[im 21/90  soft-tissue]
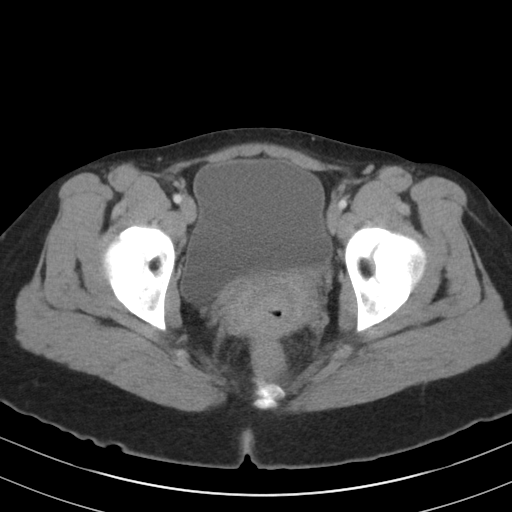
[im 25/90  soft-tissue]
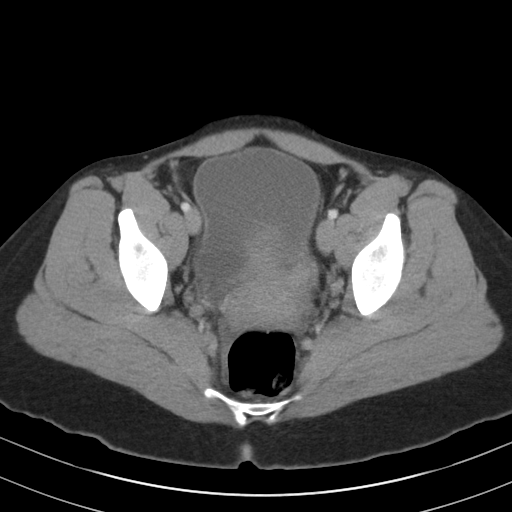
[im 33/90  soft-tissue]
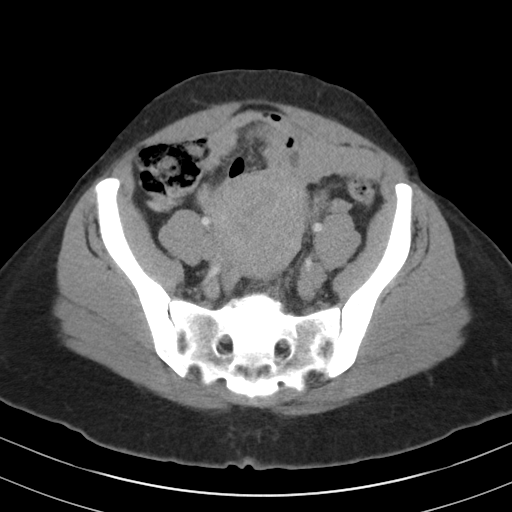
[im 37/90  soft-tissue]
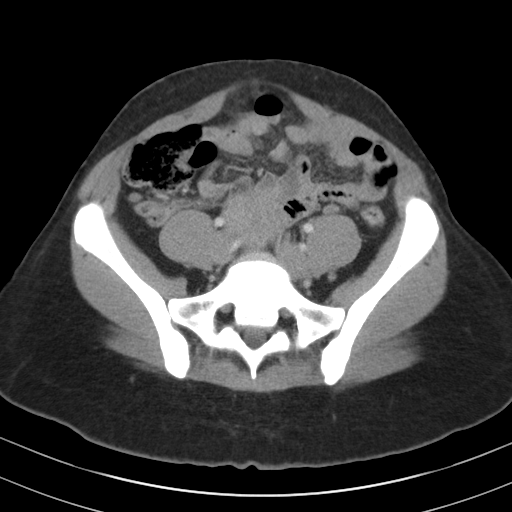
[im 45/90  soft-tissue]
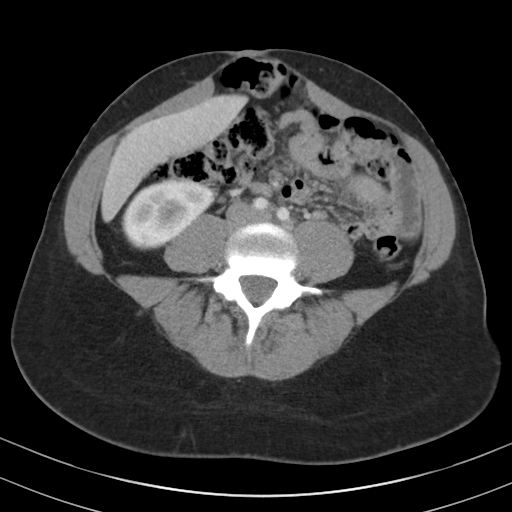
[im 53/90  soft-tissue]
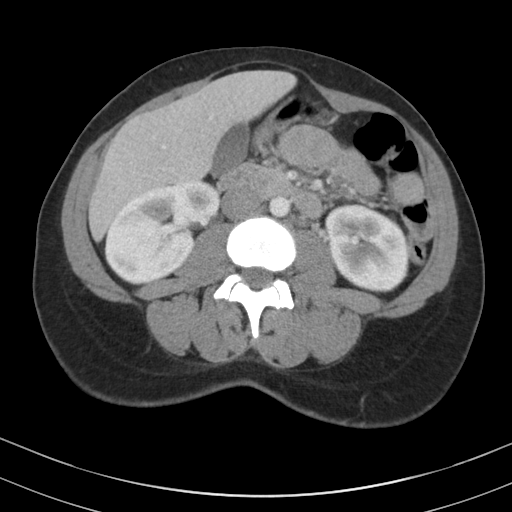
[im 57/90  soft-tissue]
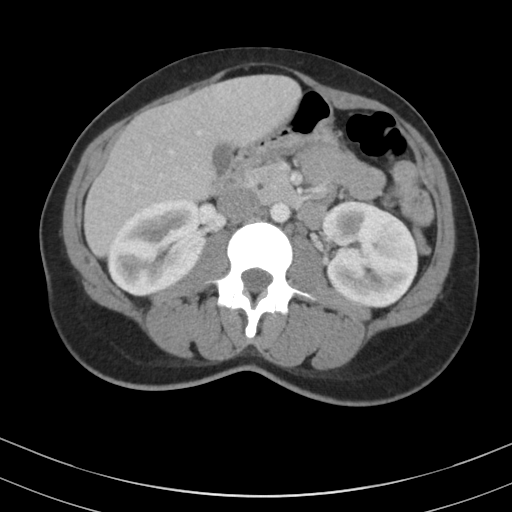
[im 57/90  bone]
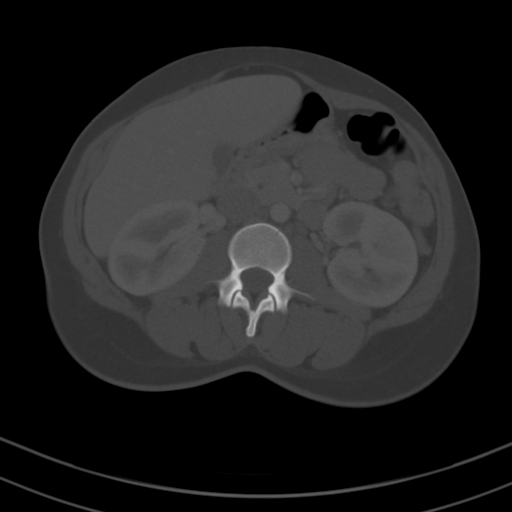
[im 65/90  soft-tissue]
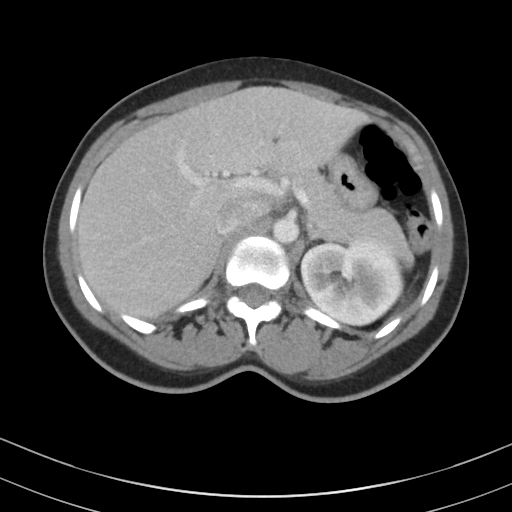
[im 69/90  soft-tissue]
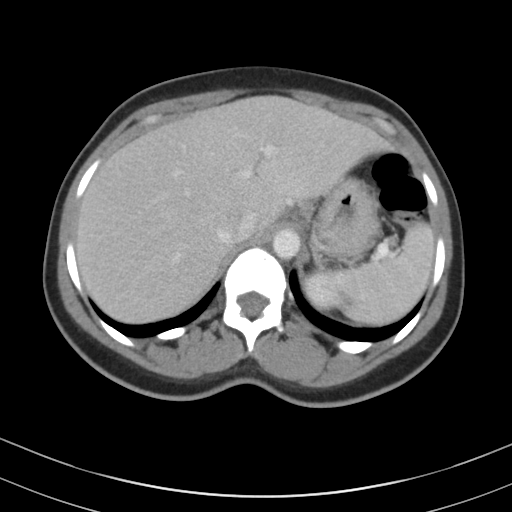
[im 77/90  soft-tissue]
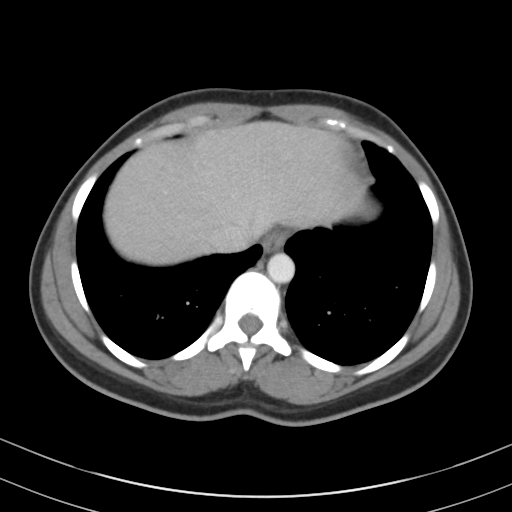
[im 85/90  soft-tissue]
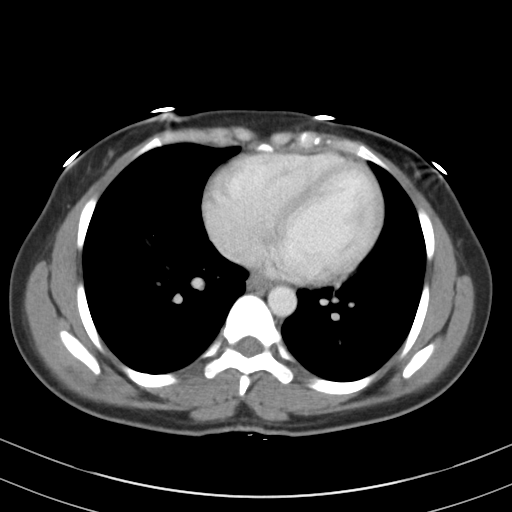

[Series 6: abdroutine 2.0 mpr cor · coronal · 0.63mm/px · 3 of 124 slices shown]
[im 42/124  soft-tissue]
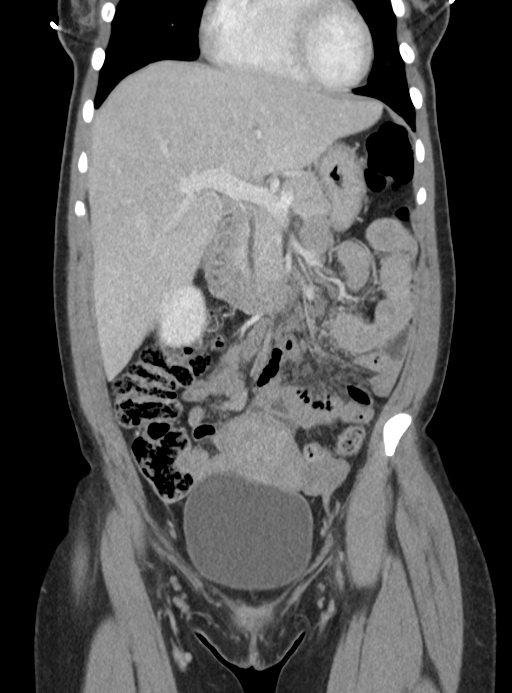
[im 55/124  soft-tissue]
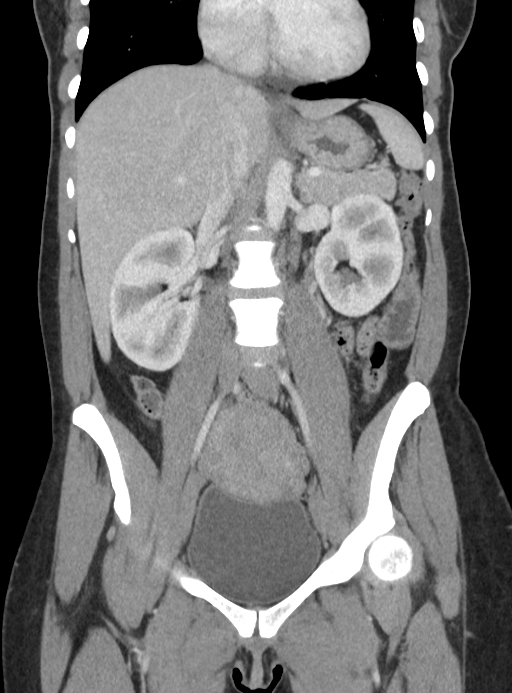
[im 69/124  soft-tissue]
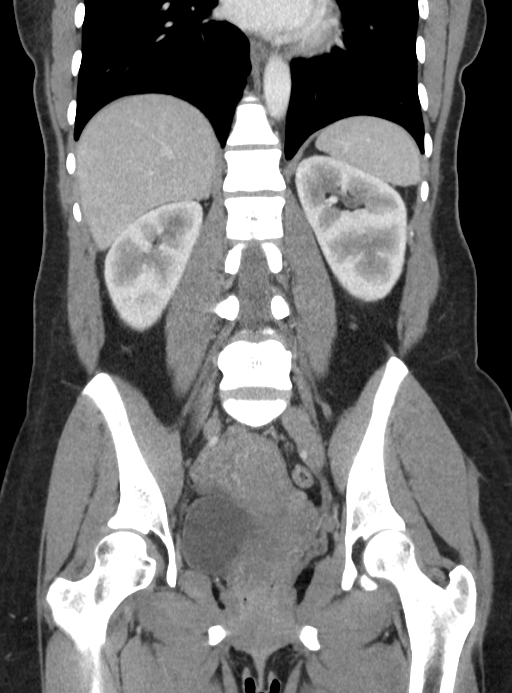

[16 of 46 positions shown; findings below may reference images not displayed]

FINDINGS: Lower chest: Lung bases are clear.

Hepatobiliary: No focal hepatic lesion. No biliary duct dilatation.
Gallbladder is normal. Common bile duct is normal.

Pancreas: Pancreas is normal. No ductal dilatation. No pancreatic
inflammation.

Spleen: Normal spleen

Adrenals/urinary tract: Adrenal glands and kidneys are normal. The
ureters and bladder normal.

Stomach/Bowel: The stomach, small bowel, appendix and cecum normal.
The colon and rectosigmoid colon are normal.

There is laxity of the abdominal wall midline with a loop of colon
approximating the skin surface (image 47, series 3; sagittal image
74, series 7) at the level the umbilicus.

Vascular/Lymphatic: Abdominal aorta is normal caliber. There is no
retroperitoneal or periportal lymphadenopathy. No pelvic
lymphadenopathy.

Reproductive: Uterus and ovaries normal

Other: No free fluid.

Musculoskeletal: No aggressive osseous lesion.
IMPRESSION: 1. Laxity abdominal wall at the umbilicus with a loop of colon
approximating the ventral abdominal wall within 5 mm the skin
surface. No frank herniation. This could represent a region of
discomfort.
2. Normal appendix.
3. No obstructive uropathy.

## 2018-06-22 ENCOUNTER — Emergency Department (HOSPITAL_COMMUNITY)
Admission: EM | Admit: 2018-06-22 | Discharge: 2018-06-23 | Disposition: A | Discharge status: Home / Self Care | Payer: Managed Care, Other (non HMO) | Attending: Emergency Medicine | Admitting: Emergency Medicine

## 2018-06-22 ENCOUNTER — Encounter (HOSPITAL_COMMUNITY): Payer: Self-pay | Admitting: Emergency Medicine

## 2018-06-22 ENCOUNTER — Emergency Department (HOSPITAL_COMMUNITY): Payer: Managed Care, Other (non HMO)

## 2018-06-22 ENCOUNTER — Other Ambulatory Visit: Payer: Self-pay

## 2018-06-22 DIAGNOSIS — Z3A01 Less than 8 weeks gestation of pregnancy: Secondary | ICD-10-CM | POA: Insufficient documentation

## 2018-06-22 DIAGNOSIS — O021 Missed abortion: Secondary | ICD-10-CM

## 2018-06-22 DIAGNOSIS — O200 Threatened abortion: Secondary | ICD-10-CM | POA: Insufficient documentation

## 2018-06-22 DIAGNOSIS — O209 Hemorrhage in early pregnancy, unspecified: Secondary | ICD-10-CM | POA: Diagnosis present

## 2018-06-22 DIAGNOSIS — O4690 Antepartum hemorrhage, unspecified, unspecified trimester: Secondary | ICD-10-CM

## 2018-06-22 LAB — CBC
HCT: 38 % (ref 36.0–46.0)
Hemoglobin: 12.6 g/dL (ref 12.0–15.0)
MCH: 29.9 pg (ref 26.0–34.0)
MCHC: 33.2 g/dL (ref 30.0–36.0)
MCV: 90 fL (ref 80.0–100.0)
PLATELETS: 297 10*3/uL (ref 150–400)
RBC: 4.22 MIL/uL (ref 3.87–5.11)
RDW: 12.8 % (ref 11.5–15.5)
WBC: 8.5 10*3/uL (ref 4.0–10.5)
nRBC: 0 % (ref 0.0–0.2)

## 2018-06-22 LAB — COMPREHENSIVE METABOLIC PANEL
ALT: 10 U/L (ref 0–44)
AST: 15 U/L (ref 15–41)
Albumin: 4.1 g/dL (ref 3.5–5.0)
Alkaline Phosphatase: 38 U/L (ref 38–126)
Anion gap: 13 (ref 5–15)
BUN: 6 mg/dL (ref 6–20)
CALCIUM: 9.4 mg/dL (ref 8.9–10.3)
CO2: 22 mmol/L (ref 22–32)
Chloride: 102 mmol/L (ref 98–111)
Creatinine, Ser: 0.45 mg/dL (ref 0.44–1.00)
GFR calc Af Amer: >60 mL/min (ref >60)
GFR calc non Af Amer: >60 mL/min (ref >60)
Glucose, Bld: 87 mg/dL (ref 70–99)
Potassium: 3.2 mmol/L — ABNORMAL LOW (ref 3.5–5.1)
Sodium: 137 mmol/L (ref 135–145)
Total Bilirubin: 0.7 mg/dL (ref 0.3–1.2)
Total Protein: 7.1 g/dL (ref 6.5–8.1)

## 2018-06-22 LAB — WET PREP, GENITAL
Sperm: NONE SEEN
Trich, Wet Prep: NONE SEEN
WBC, Wet Prep HPF POC: NONE SEEN
Yeast Wet Prep HPF POC: NONE SEEN

## 2018-06-22 LAB — URINALYSIS, ROUTINE W REFLEX MICROSCOPIC
Bilirubin Urine: NEGATIVE
GLUCOSE, UA: NEGATIVE mg/dL
HGB URINE DIPSTICK: NEGATIVE
Ketones, ur: 5 mg/dL — AB
Leukocytes, UA: NEGATIVE
Nitrite: NEGATIVE
Protein, ur: NEGATIVE mg/dL
Specific Gravity, Urine: 1.009 (ref 1.005–1.030)
pH: 6 (ref 5.0–8.0)

## 2018-06-22 LAB — LIPASE, BLOOD: Lipase: 25 U/L (ref 11–51)

## 2018-06-22 LAB — I-STAT BETA HCG BLOOD, ED (MC, WL, AP ONLY): I-stat hCG, quantitative: >2000 m[IU]/mL — ABNORMAL HIGH (ref <5)

## 2018-06-22 LAB — HCG, QUANTITATIVE, PREGNANCY: hCG, Beta Chain, Quant, S: 41309 m[IU]/mL — ABNORMAL HIGH (ref <5)

## 2018-06-22 MED ORDER — ACETAMINOPHEN 500 MG PO TABS
1000.0000 mg | ORAL_TABLET | Freq: Once | ORAL | Status: AC
  Administered 2018-06-22: 1000 mg via ORAL
  Filled 2018-06-22: qty 2

## 2018-06-22 MED ORDER — SODIUM CHLORIDE 0.9% FLUSH: 3.0000 mL | Freq: Once | INTRAVENOUS | Status: DC

## 2018-06-22 NOTE — ED Notes (Signed)
Patient transported to Ultrasound 

## 2018-06-22 NOTE — ED Provider Notes (Signed)
[name] EMERGENCY DEPARTMENT Provider Note   CSN: [id number] Arrival date & time: 06/22/18  1656     History   Chief Complaint Chief Complaint  Patient presents with  . Threatened Miscarriage    HPI Pamela Craig is a 38 y.o. female.  HPI   Pt is a 38 y/o female, G5P3A1, who presents to the ED today for evaluation of vaginal bleeding.   Pt states she has had spotting throughout this pregnancy which she has had with past pregnancies.  Today she had an episode of heavier vaginal bleeding stating she went through one pad. Denies clots. She later developed severe abd cramping. Pain is intermittent. Pain radiates to the lower back. Reports nausea and constipation, but no vomiting, diarrhea, urinary sxs. Denies having intercourse prior to onset of bleeding, but has had intercourse since she was seen on 1/14.   States she thought she had a yeast infection and started monistat about 6 days ago. States she had "cottage cheese discharge" and vaginal irritation. Symptoms have improved since she started monistat.  LMP 12/5.   Reviewed [place] from prior visit, pt is currently [redacted]w[redacted]d by last [place]. [place] reported: "Probable early intrauterine gestational sac, but no yolk sac, fetal pole, or cardiac activity yet visualized. Recommend follow-up quantitative B-HCG levels and follow-up US in 14 days to assess viability. This recommendation follows SRU consensus guidelines: Diagnostic Criteria for Nonviable Pregnancy Early in the First Trimester. [name] Med 2013; 765:4650-35. Also with 2.2 cm right ovarian corpus luteal cyst with associated small amount of free fluid within the pelvis. Possible 2 cm fibroid within the left aspect of the uterus.. Recommended repeat US in 14 days."  History reviewed. No pertinent past medical history.  There are no active problems to display for this patient.   History reviewed. No pertinent surgical history.   OB History    Gravida  1   Para        Term      Preterm      AB      Living        SAB      TAB      Ectopic      Multiple      Live Births               Home Medications    Prior to Admission medications   Medication Sig Start Date End Date Taking? Authorizing Provider  cephALEXin (KEFLEX) 500 MG capsule Take 1 capsule (500 mg total) by mouth 4 (four) times daily. 06/06/18   Benjiman Core, MD  cyclobenzaprine (FLEXERIL) 5 MG tablet Take 1 tablet (5 mg total) by mouth 3 (three) times daily as needed for muscle spasms. Patient not taking: Reported on 06/06/2018 11/08/17   Wallis Bamberg, PA-C  fluticasone [facility]) 50 MCG/ACT nasal spray Place 1 spray into both nostrils daily. Patient not taking: Reported on 06/06/2018 01/16/18   Georgetta Haber, NP  Prenatal Vit-Fe Fumarate-FA (PRENATAL PO) Take 1 tablet by mouth 2 (two) times daily.    [provider]    Family History No family history on file.  Social History Social History   Tobacco Use  . Smoking status: Never Smoker  . Smokeless tobacco: Never Used  Substance Use Topics  . Alcohol use: Yes    Comment: occ  . Drug use: No     Allergies   Aspirin   Review of Systems Review of Systems  Constitutional: Negative  for chills and fever.  HENT: Negative for ear pain and sore throat.   Eyes: Negative for pain and visual disturbance.  Respiratory: Negative for cough and shortness of breath.   Cardiovascular: Negative for chest pain and palpitations.  Gastrointestinal: Positive for abdominal pain. Negative for constipation, diarrhea, nausea and vomiting.  Genitourinary: Positive for vaginal bleeding and vaginal discharge. Negative for dysuria and hematuria.  Musculoskeletal: Positive for back pain.  Skin: Negative for rash.  Neurological: Negative for syncope and headaches.  All other systems reviewed and are negative.    Physical Exam Updated Vital Signs BP 132/72 (BP Location: Right Arm)   Pulse 62   Temp 98.8 F (37.1 C)  (Oral)   Resp 16   Ht 5\' 2"  (1.575 m)   Wt 65.8 kg   LMP 04/27/2018   SpO2 100%   BMI 26.52 kg/m   Physical Exam Vitals signs and nursing note reviewed.  Constitutional:      General: She is not in acute distress.    Appearance: She is well-developed.  HENT:     Head: Normocephalic and atraumatic.  Eyes:     Conjunctiva/sclera: Conjunctivae normal.  Neck:     Musculoskeletal: Neck supple.  Cardiovascular:     Rate and Rhythm: Normal rate and regular rhythm.     Heart sounds: No murmur.     Comments: HR 60s on monitor. NSR. Pulmonary:     Effort: Pulmonary effort is normal. No respiratory distress.     Breath sounds: Normal breath sounds.  Abdominal:     Palpations: Abdomen is soft.     Tenderness: There is no abdominal tenderness.  Genitourinary:    Comments: Exam performed by Karrie Meresortni S Aveer Bartow,  exam chaperoned Date: 06/23/2018 Pelvic exam: normal external genitalia without evidence of trauma. VULVA: normal appearing vulva with no masses, tenderness or lesion. VAGINA: normal appearing vagina with normal color, copious white discharge noted. CERVIX: normal appearing cervix without lesions, cervical motion tenderness absent, cervical os closed with out purulent discharge; copious white vaginal discharge noted, Wet prep and DNA probe for chlamydia and GC obtained.   ADNEXA: normal adnexa in size, right adnexal TTP UTERUS: uterus is normal size, shape, consistency and nontender.  Skin:    General: Skin is warm and dry.  Neurological:     Mental Status: She is alert.      ED Treatments / Results  Labs (all labs ordered are listed, but only abnormal results are displayed) Labs Reviewed  WET PREP, GENITAL - Abnormal; Notable for the following components:      Result Value   Clue Cells Wet Prep HPF POC PRESENT (*)    All other components within normal limits  COMPREHENSIVE METABOLIC PANEL - Abnormal; Notable for the following components:   Potassium 3.2 (*)    All  other components within normal limits  URINALYSIS, ROUTINE W REFLEX MICROSCOPIC - Abnormal; Notable for the following components:   Ketones, ur 5 (*)    All other components within normal limits  HCG, QUANTITATIVE, PREGNANCY - Abnormal; Notable for the following components:   hCG, Beta Chain, Quant, S 41,309 (*)    All other components within normal limits  I-STAT BETA HCG BLOOD, ED ([facility], [facility], [facility] ONLY) - Abnormal; Notable for the following components:   I-stat hCG, quantitative >2,000.0 (*)    All other components within normal limits  LIPASE, BLOOD  CBC  GC/CHLAMYDIA PROBE AMP ([place]) NOT AT [facility]    EKG None  Radiology  US Ob Less Than 14 Weeks With Ob Transvaginal  Result Date: 06/22/2018 CLINICAL DATA:  Vaginal bleeding. Estimated gestational age by LMP is 8 weeks 0 days. Quantitative beta HCG is 41,309 and rising. EXAM: OBSTETRIC <14 WK [place] AND TRANSVAGINAL OB US TECHNIQUE: Both transabdominal and transvaginal ultrasound examinations were performed for complete evaluation of the gestation as well as the maternal uterus, adnexal regions, and pelvic cul-de-sac. Transvaginal technique was performed to assess early pregnancy. COMPARISON:  06/06/2018 FINDINGS: Intrauterine gestational sac: A single intrauterine gestational sac is identified. Yolk sac:  Not identified. Embryo:  Not identified. Cardiac Activity: Not identified. MSD: 12 mm   6 w   0 d Subchorionic hemorrhage: Heterogeneous echogenic and hypoechoic structures identified adjacent to the gestational sac probably representing subchorionic hemorrhage. Developing since prior study. Maternal uterus/adnexae: Uterus is anteverted. No myometrial masses. Nabothian cysts are present. Both ovaries are visualized and appear normal. No abnormal adnexal masses. No abnormal pelvic fluid. IMPRESSION: A single intrauterine gestational sac is identified but no yolk sac, fetal pole, or fetal cardiac activity are seen. Mean sac diameter is consistent  with estimated gestational age of [redacted] weeks 0 days, representing less than expected growth since the previous study. Developing subchorionic hemorrhage since prior study. Based on absence of embryo greater than 2 weeks after a scan showing a gestational sac without a yolk sac, findings meet definitive criteria for failed pregnancy. This follows SRU consensus guidelines: Diagnostic Criteria for Nonviable Pregnancy Early in the First Trimester. Macy Mis J Med [phone]. Electronically Signed   By: Burman Nieves M.D.   On: 06/22/2018 22:28    Procedures Procedures (including critical care time)  Medications Ordered in ED Medications  sodium chloride flush (NS) 0.9 % injection 3 mL (has no administration in time range)  acetaminophen (TYLENOL) tablet 1,000 mg (1,000 mg Oral Given 06/22/18 2124)     Initial Impression / Assessment and Plan / ED Course  I have reviewed the triage vital signs and the nursing notes.  Pertinent labs & imaging results that were available during my care of the patient were reviewed by me and considered in my medical decision making (see chart for details).     Final Clinical Impressions(s) / ED Diagnoses   Final diagnoses:  Missed abortion   38 y/o female currently pregnant presenting with vaginal bleeding and abd cramping. Also reporting recent sxs of yeast infection and has started monistat.  Initially tachycardic, this resolved during visit. No fevers.   On pelvic exam there is white discharge present likely from recent monistat use. No blood noted in the vaginal vault. Cervical os appears closed. Mild right adnexal and uterine TTP.   CBC wnl. cmp with mild hypokalemia otherwise normal Beta quant elevated at 41000 UA with ketones but no other abnormalities or findings consistent with UTI Wet prep without yeast. Clue cells present but no WBC noted.   Pelvic US with  single intrauterine gestational sac is identified but no yolk sac, fetal pole, or  fetal cardiac activity are seen. Mean sac diameter is consistent with estimated gestational age of [redacted] weeks 0 days, representing less than expected growth since the previous study. Developing subchorionic hemorrhage since prior study. Based on absence of embryo greater than 2 weeks after a scan showing a gestational sac without a yolk sac, findings meet definitive criteria for failed pregnancy.  Discussed case with Dr. Leroy Libman from [facility] who states that this [place] represents missed abortion and that patient will need to  be seen within the next several days. She advises to hold on giving any medication at this time. Her office with reach out to the patient to make a f/u appt. Advised that pt will need to return for severe vaginal bleeding.  Findings discussed with pt and above plan was discussed with the patient. She is aware of the plan and voices understanding. She agrees to f/u as directed and will return if worse. All questions answered. Pt stable for dc.  ED Discharge Orders    None       Rayne DuCouture, Trachelle Low S, PA-C 06/23/18 0117    Linwood DibblesKnapp, Jon, MD 06/24/18 1001

## 2018-06-22 NOTE — Discharge Instructions (Signed)
Please continue to monitor your symptoms closely.  Dr. Jinny [facility] office will contact you to make an appointment for follow-up.  You should have a follow-up appointment scheduled within the next several days.  If you are not contacted by the office you will need to call the number listed on your discharge paperwork to make an appointment.  If you have any increasing pain, fevers or heavy vaginal bleeding then you should return to the emergency department or [facility] for reevaluation.

## 2018-06-22 NOTE — ED Triage Notes (Signed)
Pt states she started bleeding this morning and now is having intense pain and cramping with bleeding. Pt reports she is [redacted] weeks pregnant.

## 2018-06-23 ENCOUNTER — Telehealth: Payer: Self-pay | Admitting: Family Medicine

## 2018-06-23 LAB — GC/CHLAMYDIA PROBE AMP (~~LOC~~) NOT AT ARMC
Chlamydia: NEGATIVE
NEISSERIA GONORRHEA: NEGATIVE

## 2018-06-23 NOTE — ED Notes (Signed)
Patient verbalizes understanding of discharge instructions. Opportunity for questioning and answers were provided. Armband removed by staff, pt discharged from ED ambulatory.   

## 2018-06-23 NOTE — Telephone Encounter (Signed)
Attempted to call pt to get her scheduled for a missed ab w/ any provider. She was seen in the [facility] ED. No answer, LVM to give the office a call back.

## 2018-07-04 ENCOUNTER — Encounter: Payer: Managed Care, Other (non HMO) | Admitting: Obstetrics and Gynecology

## 2018-07-04 DIAGNOSIS — Z01419 Encounter for gynecological examination (general) (routine) without abnormal findings: Secondary | ICD-10-CM

## 2020-02-15 IMAGING — US US OB TRANSVAGINAL
1 series · 13 of 28 positions shown · non-contrast
Comparison: None.

CLINICAL DATA: Initial evaluation for acute vaginal bleeding, early
pregnancy.

EXAM:
OBSTETRIC <14 WK US AND TRANSVAGINAL OB US
TECHNIQUE: Both transabdominal and transvaginal ultrasound examinations were
performed for complete evaluation of the gestation as well as the
maternal uterus, adnexal regions, and pelvic cul-de-sac.
Transvaginal technique was performed to assess early pregnancy.

[Series 1: us ob transvaginal · 76 acquisitions, 13 frames shown]
[im 3/76]
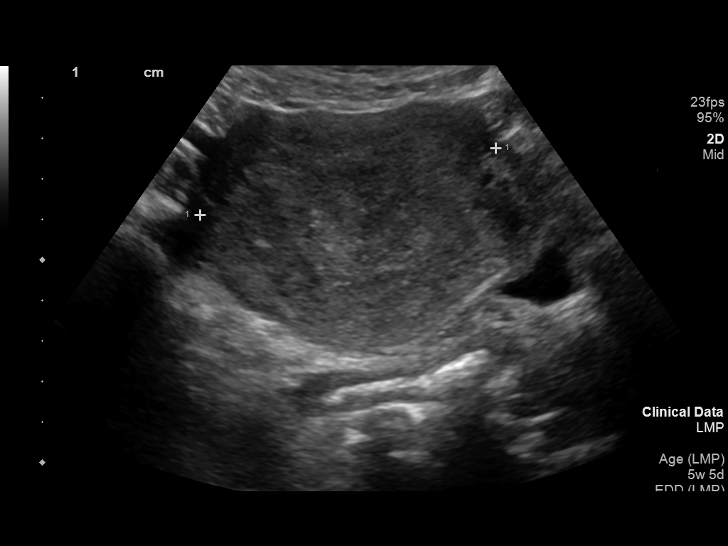
[im 9/76]
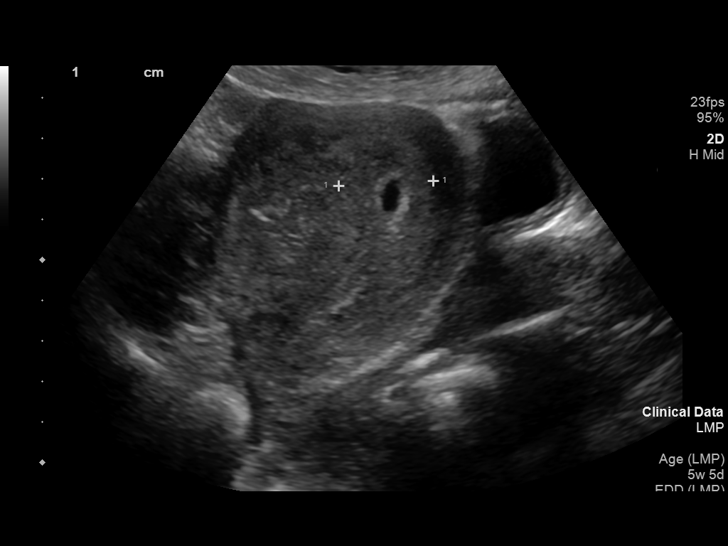
[im 14/76]
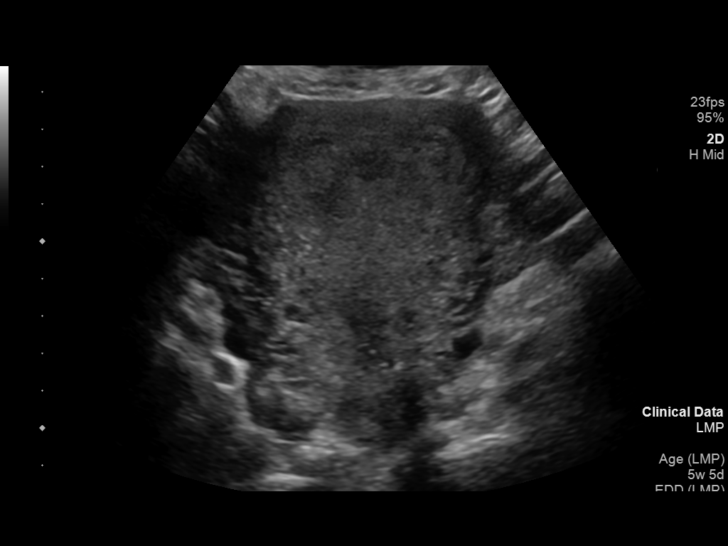
[im 20/76]
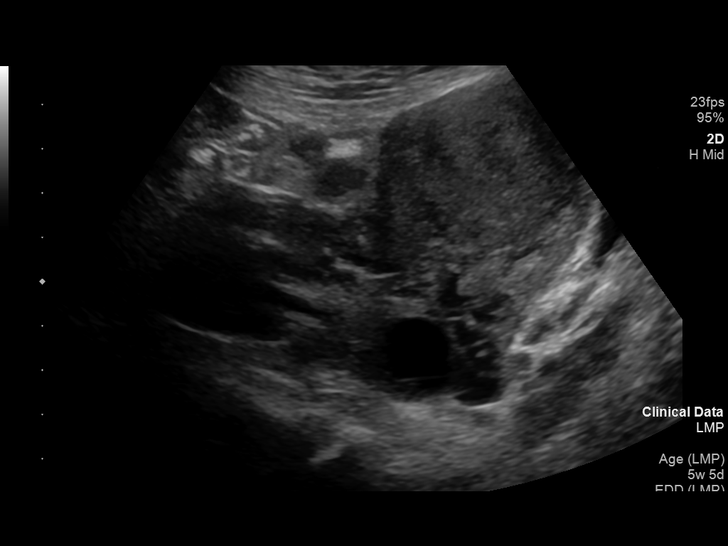
[im 26/76]
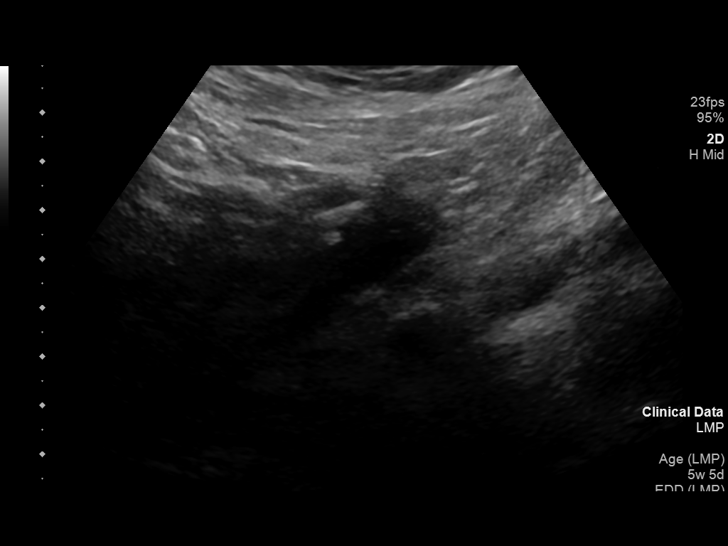
[im 31/76]
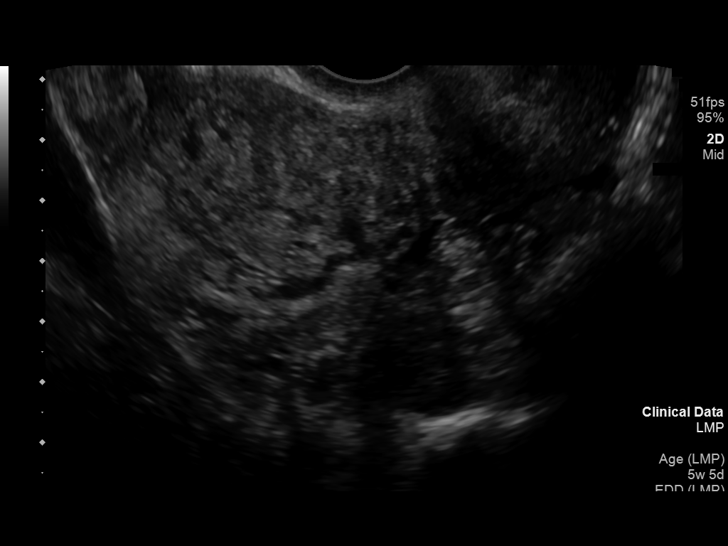
[im 39/76]
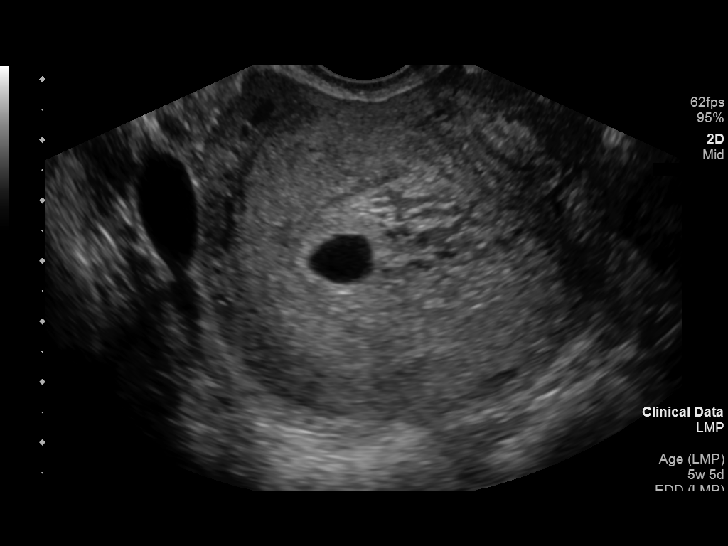
[im 45/76]
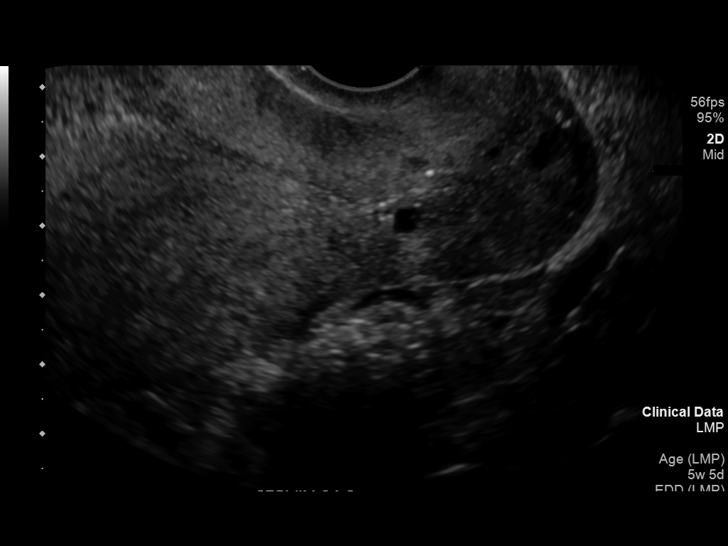
[im 51/76]
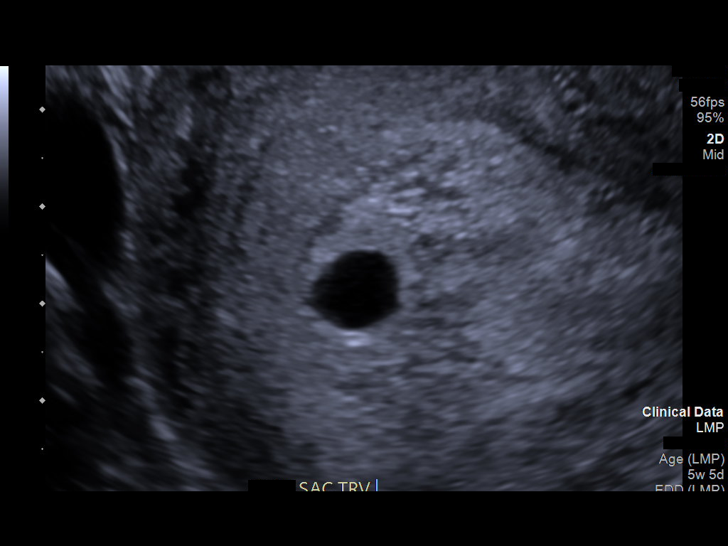
[im 56/76]
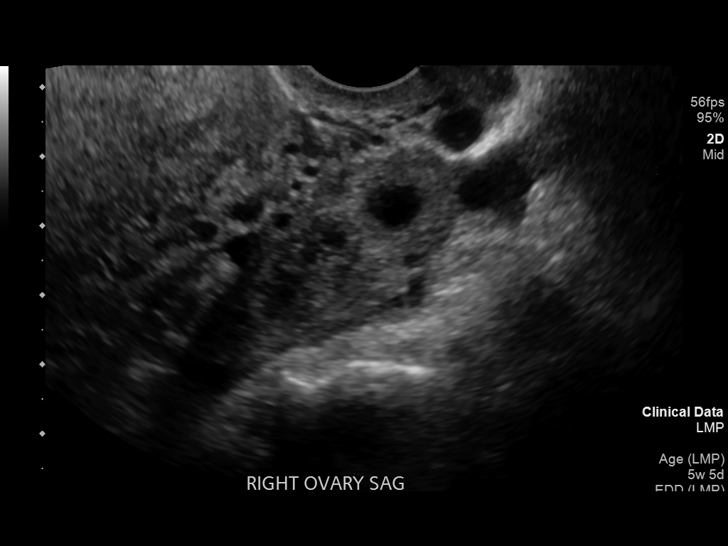
[im 62/76]
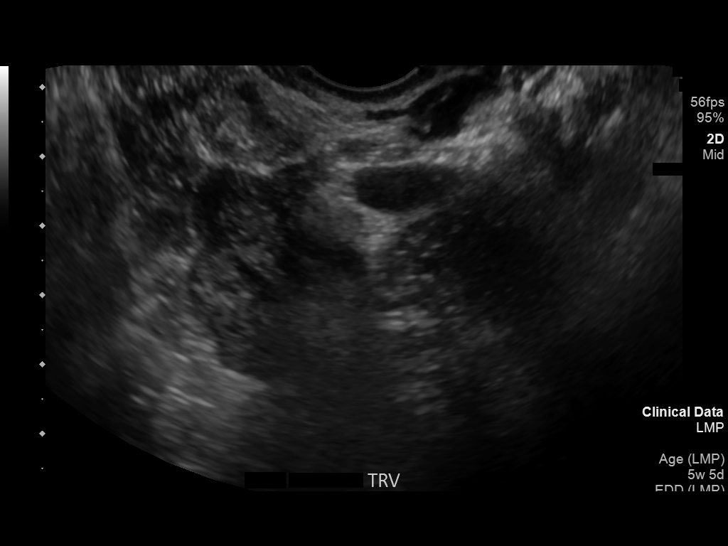
[im 67/76]
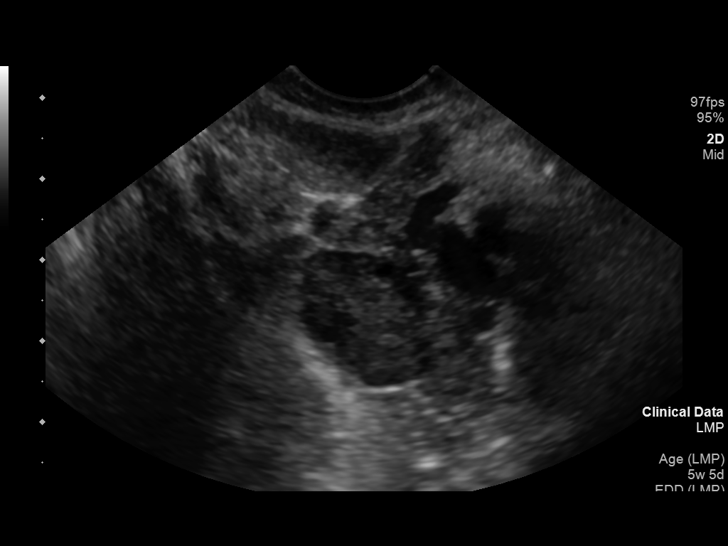
[im 73/76]
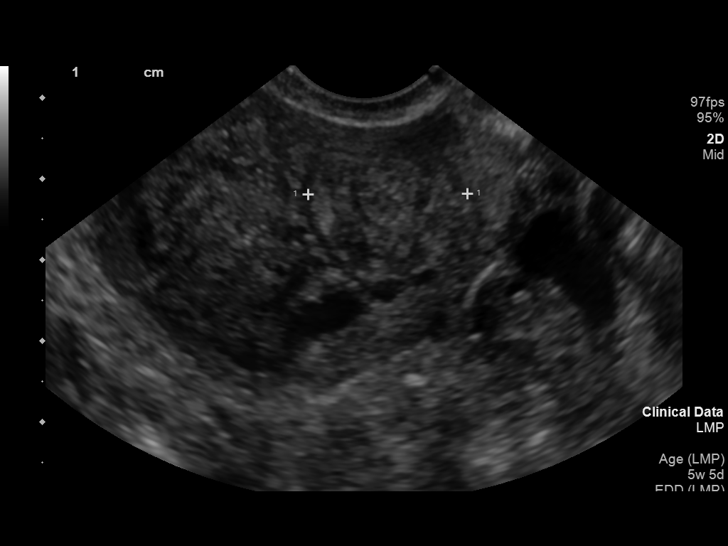

[13 of 28 positions shown; findings below may reference images not displayed]

FINDINGS: Intrauterine gestational sac: Single

Yolk sac:  Not visualized.

Embryo:  Not visualized.

Cardiac Activity: N/A

Heart Rate: N/A  bpm

MSD: 8.2 mm   5 w   4 d

Subchorionic hemorrhage:  None visualized.

Maternal uterus/adnexae: 2.2 x 1.5 x 1.4 cm mildly complex right
ovarian cyst with peripherally increased vascularity, most
consistent with a small corpus luteal cyst. Small amount of adjacent
free fluid. Left ovary normal in appearance.

Possible focal lesion within the left aspect of the myometrium
measures 2.0 x 1.8 x 1.7 cm, potentially reflecting a small fibroid.
IMPRESSION: 1. Probable early intrauterine gestational sac, but no yolk sac,
fetal pole, or cardiac activity yet visualized. Recommend follow-up
quantitative B-HCG levels and follow-up US in 14 days to assess
viability. This recommendation follows SRU consensus guidelines:
Diagnostic Criteria for Nonviable Pregnancy Early in the First
Trimester. N Engl J Med 8116; [DATE].
2. 2.2 cm right ovarian corpus luteal cyst with associated small
amount of free fluid within the pelvis.
3. Possible 2 cm fibroid within the left aspect of the uterus.

## 2020-03-02 IMAGING — US US OB < 14 WEEKS - US OB TV
1 series · 13 of 28 positions shown · non-contrast
Comparison: 06/06/2018

CLINICAL DATA: Vaginal bleeding. Estimated gestational age by LMP
is 8 weeks 0 days. Quantitative beta HCG is 41,309 and rising.

EXAM:
OBSTETRIC <14 WK US AND TRANSVAGINAL OB US
TECHNIQUE: Both transabdominal and transvaginal ultrasound examinations were
performed for complete evaluation of the gestation as well as the
maternal uterus, adnexal regions, and pelvic cul-de-sac.
Transvaginal technique was performed to assess early pregnancy.

[Series 1: us ob < 14 weeks - us ob tv · 57 acquisitions, 13 frames shown]
[im 3/57]
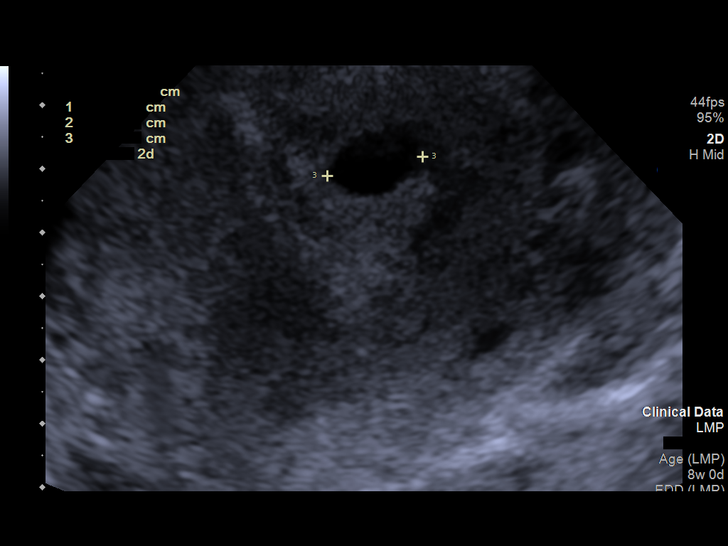
[im 7/57]
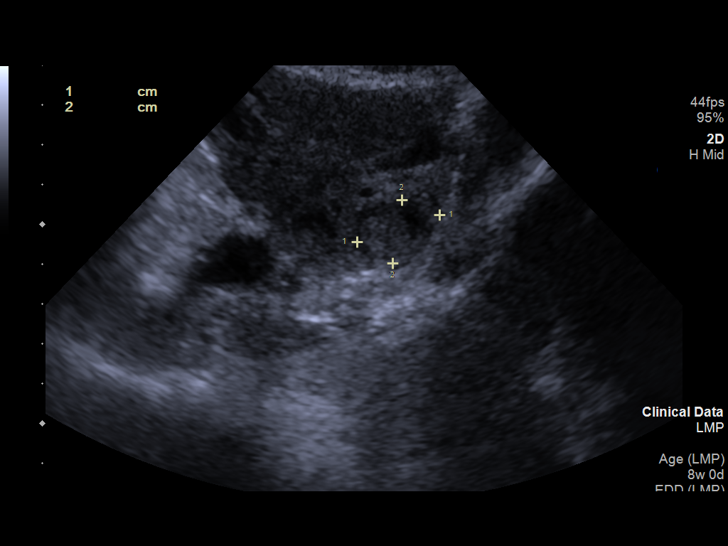
[im 11/57]
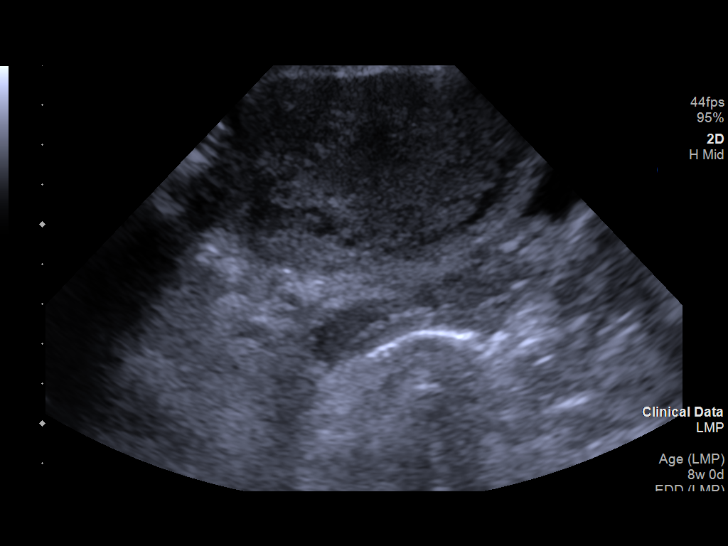
[im 15/57]
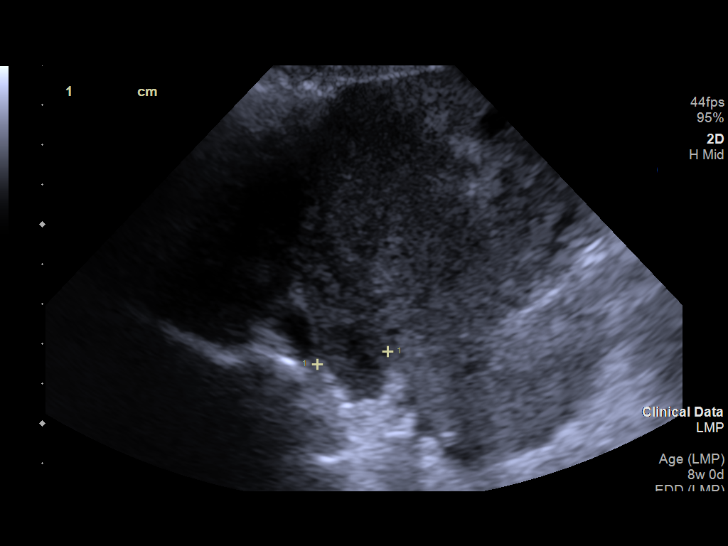
[im 19/57]
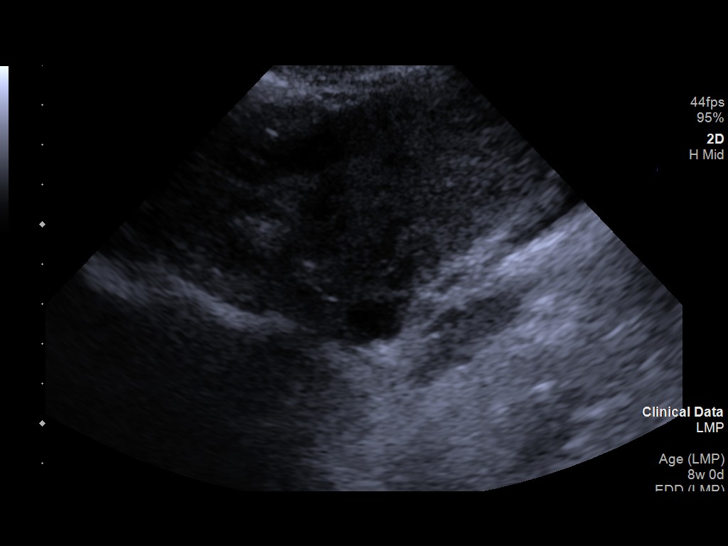
[im 23/57]
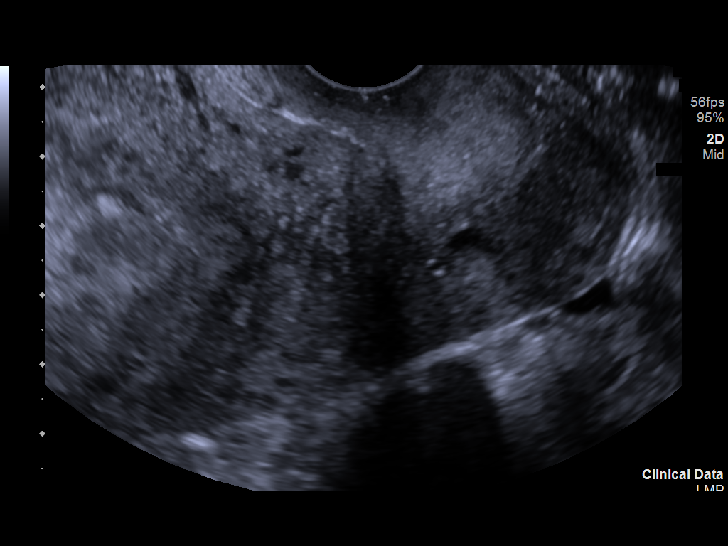
[im 30/57]
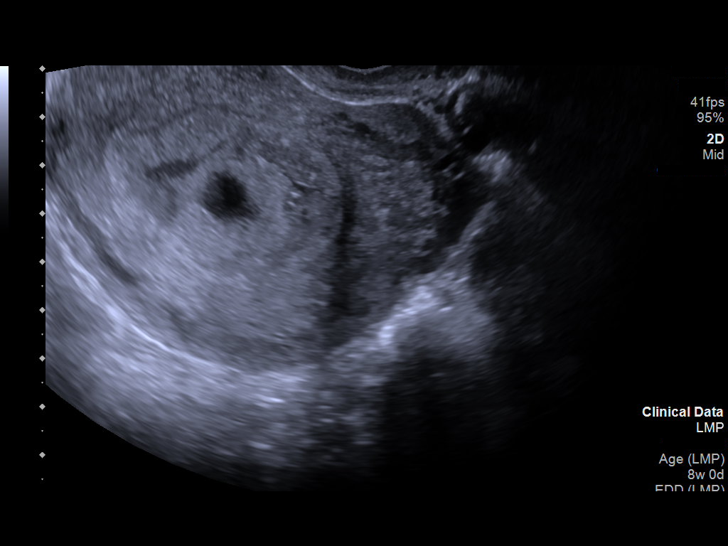
[im 34/57]
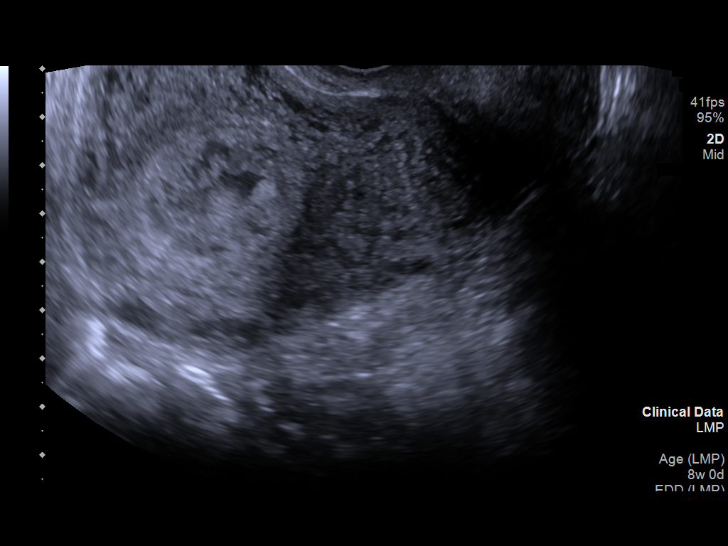
[im 38/57]
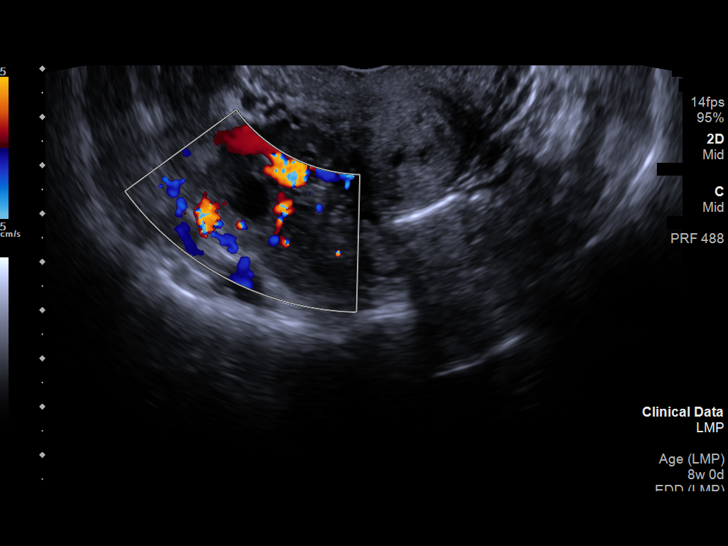
[im 42/57]
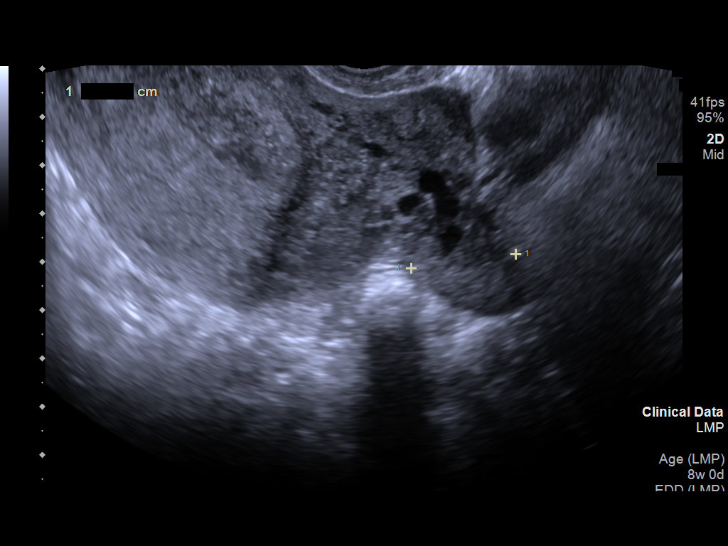
[im 46/57]
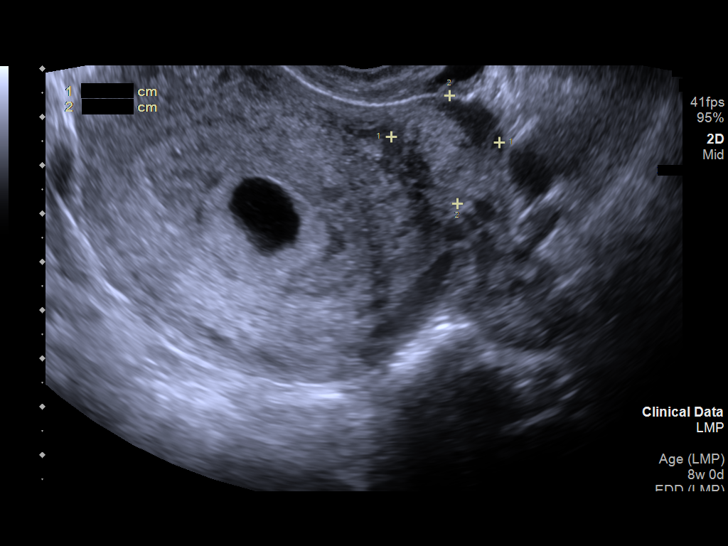
[im 50/57]
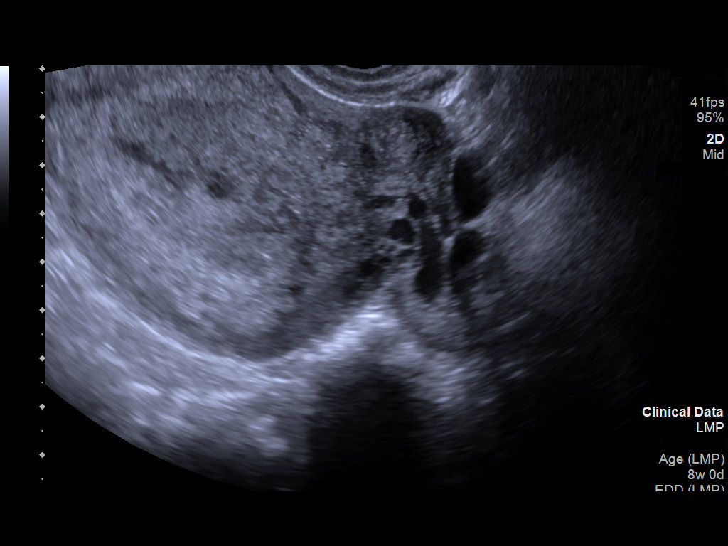
[im 54/57]
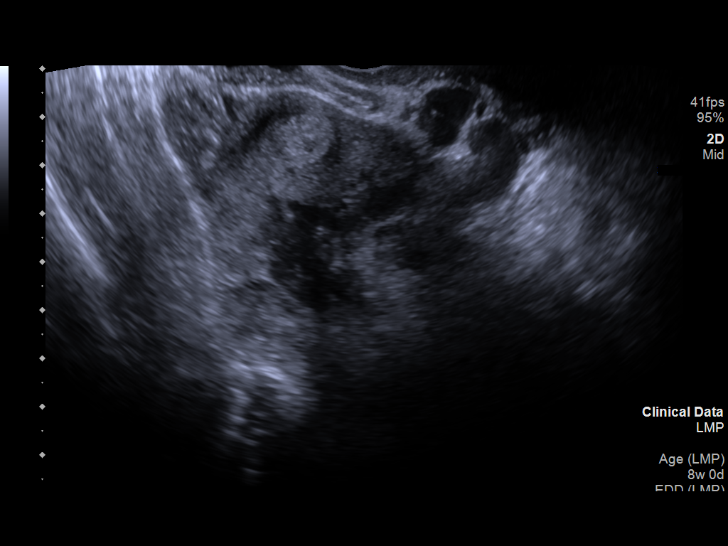

[13 of 28 positions shown; findings below may reference images not displayed]

FINDINGS: Intrauterine gestational sac: A single intrauterine gestational sac
is identified.

Yolk sac:  Not identified.

Embryo:  Not identified.

Cardiac Activity: Not identified.

MSD: 12 mm   6 w   0 d

Subchorionic hemorrhage: Heterogeneous echogenic and hypoechoic
structures identified adjacent to the gestational sac probably
representing subchorionic hemorrhage. Developing since prior study.

Maternal uterus/adnexae: Uterus is anteverted. No myometrial masses.
Nabothian cysts are present. Both ovaries are visualized and appear
normal. No abnormal adnexal masses. No abnormal pelvic fluid.
IMPRESSION: A single intrauterine gestational sac is identified but no yolk sac,
fetal pole, or fetal cardiac activity are seen. Mean sac diameter is
consistent with estimated gestational age of 6 weeks 0 days,
representing less than expected growth since the previous study.
Developing subchorionic hemorrhage since prior study. Based on
absence of embryo greater than 2 weeks after a scan showing a
gestational sac without a yolk sac, findings meet definitive
criteria for failed pregnancy. This follows SRU consensus
guidelines: Diagnostic Criteria for Nonviable Pregnancy Early in the
First Trimester. N Engl J Med 9757;[DATE].
